# Patient Record
Sex: Female | Born: 1950 | Race: White | Hispanic: No | Marital: Married | Smoking: Never smoker
Health system: Southern US, Community
[De-identification: ages and names within clinical notes are randomized; demographics above are authoritative.]

## PROBLEM LIST (undated history)

## (undated) DIAGNOSIS — Z9889 Other specified postprocedural states: Secondary | ICD-10-CM

## (undated) DIAGNOSIS — R7301 Impaired fasting glucose: Secondary | ICD-10-CM

## (undated) DIAGNOSIS — M1990 Unspecified osteoarthritis, unspecified site: Secondary | ICD-10-CM

## (undated) DIAGNOSIS — I6529 Occlusion and stenosis of unspecified carotid artery: Secondary | ICD-10-CM

## (undated) DIAGNOSIS — R112 Nausea with vomiting, unspecified: Secondary | ICD-10-CM

## (undated) DIAGNOSIS — J9819 Other pulmonary collapse: Secondary | ICD-10-CM

## (undated) HISTORY — PX: KNEE ARTHROSCOPY: SUR90

## (undated) HISTORY — DX: Unspecified osteoarthritis, unspecified site: M19.90

## (undated) HISTORY — DX: Other pulmonary collapse: J98.19

## (undated) HISTORY — PX: ANKLE SURGERY: SHX546

## (undated) HISTORY — PX: LUNG SURGERY: SHX703

## (undated) HISTORY — PX: LUMBAR LAMINECTOMY: SHX95

## (undated) HISTORY — PX: COLONOSCOPY: SHX174

## (undated) HISTORY — DX: Impaired fasting glucose: R73.01

## (undated) HISTORY — PX: THYROIDECTOMY: SHX17

## (undated) HISTORY — DX: Occlusion and stenosis of unspecified carotid artery: I65.29

## (undated) HISTORY — PX: EYE SURGERY: SHX253

---

## 1998-07-23 ENCOUNTER — Encounter: Payer: Self-pay | Admitting: Orthopedic Surgery

## 1998-07-23 ENCOUNTER — Ambulatory Visit (HOSPITAL_COMMUNITY): Admission: RE | Admit: 1998-07-23 | Discharge: 1998-07-23 | Payer: Self-pay | Admitting: Orthopedic Surgery

## 1998-08-06 ENCOUNTER — Encounter: Payer: Self-pay | Admitting: Orthopedic Surgery

## 1998-08-06 ENCOUNTER — Ambulatory Visit (HOSPITAL_COMMUNITY): Admission: RE | Admit: 1998-08-06 | Discharge: 1998-08-06 | Payer: Self-pay | Admitting: Orthopedic Surgery

## 1998-08-20 ENCOUNTER — Ambulatory Visit (HOSPITAL_COMMUNITY): Admission: RE | Admit: 1998-08-20 | Discharge: 1998-08-20 | Payer: Self-pay | Admitting: Orthopedic Surgery

## 1999-01-15 ENCOUNTER — Ambulatory Visit (HOSPITAL_COMMUNITY): Admission: RE | Admit: 1999-01-15 | Discharge: 1999-01-15 | Payer: Self-pay | Admitting: Orthopedic Surgery

## 1999-01-15 ENCOUNTER — Encounter: Payer: Self-pay | Admitting: Orthopedic Surgery

## 1999-02-04 ENCOUNTER — Encounter: Payer: Self-pay | Admitting: Orthopedic Surgery

## 1999-02-04 ENCOUNTER — Ambulatory Visit (HOSPITAL_COMMUNITY): Admission: RE | Admit: 1999-02-04 | Discharge: 1999-02-04 | Payer: Self-pay | Admitting: Orthopedic Surgery

## 1999-02-19 ENCOUNTER — Ambulatory Visit (HOSPITAL_COMMUNITY): Admission: RE | Admit: 1999-02-19 | Discharge: 1999-02-19 | Payer: Self-pay | Admitting: Orthopedic Surgery

## 1999-02-19 ENCOUNTER — Encounter: Payer: Self-pay | Admitting: Orthopedic Surgery

## 1999-11-03 ENCOUNTER — Encounter: Admission: RE | Admit: 1999-11-03 | Discharge: 1999-11-03 | Payer: Self-pay | Admitting: Obstetrics and Gynecology

## 1999-11-03 ENCOUNTER — Encounter: Payer: Self-pay | Admitting: Obstetrics and Gynecology

## 2000-03-08 ENCOUNTER — Encounter: Payer: Self-pay | Admitting: Neurosurgery

## 2000-03-10 ENCOUNTER — Inpatient Hospital Stay (HOSPITAL_COMMUNITY): Admission: RE | Admit: 2000-03-10 | Discharge: 2000-03-11 | Payer: Self-pay | Admitting: Neurosurgery

## 2000-03-10 ENCOUNTER — Encounter: Payer: Self-pay | Admitting: Neurosurgery

## 2000-03-15 ENCOUNTER — Ambulatory Visit (HOSPITAL_BASED_OUTPATIENT_CLINIC_OR_DEPARTMENT_OTHER): Admission: RE | Admit: 2000-03-15 | Discharge: 2000-03-15 | Payer: Self-pay | Admitting: Plastic Surgery

## 2000-04-29 ENCOUNTER — Encounter: Payer: Self-pay | Admitting: Neurosurgery

## 2000-04-29 ENCOUNTER — Ambulatory Visit (HOSPITAL_COMMUNITY): Admission: RE | Admit: 2000-04-29 | Discharge: 2000-04-29 | Payer: Self-pay | Admitting: Neurosurgery

## 2000-05-06 ENCOUNTER — Ambulatory Visit (HOSPITAL_COMMUNITY): Admission: RE | Admit: 2000-05-06 | Discharge: 2000-05-07 | Payer: Self-pay | Admitting: Neurosurgery

## 2000-05-18 ENCOUNTER — Encounter: Payer: Self-pay | Admitting: Emergency Medicine

## 2000-05-19 ENCOUNTER — Inpatient Hospital Stay (HOSPITAL_COMMUNITY): Admission: EM | Admit: 2000-05-19 | Discharge: 2000-05-20 | Payer: Self-pay | Admitting: *Deleted

## 2000-05-19 ENCOUNTER — Encounter: Payer: Self-pay | Admitting: Thoracic Surgery (Cardiothoracic Vascular Surgery)

## 2000-05-20 ENCOUNTER — Encounter: Payer: Self-pay | Admitting: Thoracic Surgery (Cardiothoracic Vascular Surgery)

## 2000-05-26 ENCOUNTER — Encounter
Admission: RE | Admit: 2000-05-26 | Discharge: 2000-05-26 | Payer: Self-pay | Admitting: Thoracic Surgery (Cardiothoracic Vascular Surgery)

## 2000-05-26 ENCOUNTER — Encounter: Payer: Self-pay | Admitting: Thoracic Surgery (Cardiothoracic Vascular Surgery)

## 2000-12-12 ENCOUNTER — Ambulatory Visit (HOSPITAL_COMMUNITY): Admission: RE | Admit: 2000-12-12 | Discharge: 2000-12-12 | Payer: Self-pay | Admitting: Neurosurgery

## 2000-12-12 ENCOUNTER — Encounter: Payer: Self-pay | Admitting: Neurosurgery

## 2001-10-11 ENCOUNTER — Ambulatory Visit (HOSPITAL_COMMUNITY): Admission: RE | Admit: 2001-10-11 | Discharge: 2001-10-11 | Payer: Self-pay | Admitting: Internal Medicine

## 2001-10-11 ENCOUNTER — Encounter: Payer: Self-pay | Admitting: Internal Medicine

## 2001-12-27 ENCOUNTER — Ambulatory Visit (HOSPITAL_COMMUNITY): Admission: RE | Admit: 2001-12-27 | Discharge: 2001-12-27 | Payer: Self-pay | Admitting: Otolaryngology

## 2001-12-27 ENCOUNTER — Encounter: Payer: Self-pay | Admitting: Otolaryngology

## 2001-12-27 ENCOUNTER — Encounter (INDEPENDENT_AMBULATORY_CARE_PROVIDER_SITE_OTHER): Payer: Self-pay | Admitting: *Deleted

## 2002-04-12 ENCOUNTER — Encounter (INDEPENDENT_AMBULATORY_CARE_PROVIDER_SITE_OTHER): Payer: Self-pay | Admitting: *Deleted

## 2002-04-12 ENCOUNTER — Inpatient Hospital Stay (HOSPITAL_COMMUNITY): Admission: RE | Admit: 2002-04-12 | Discharge: 2002-04-13 | Payer: Self-pay | Admitting: Otolaryngology

## 2003-08-20 ENCOUNTER — Other Ambulatory Visit: Admission: RE | Admit: 2003-08-20 | Discharge: 2003-08-20 | Payer: Self-pay | Admitting: Gynecology

## 2003-08-27 ENCOUNTER — Encounter: Payer: Self-pay | Admitting: Gynecology

## 2003-08-27 ENCOUNTER — Encounter: Admission: RE | Admit: 2003-08-27 | Discharge: 2003-08-27 | Payer: Self-pay | Admitting: Gynecology

## 2004-09-15 ENCOUNTER — Other Ambulatory Visit: Admission: RE | Admit: 2004-09-15 | Discharge: 2004-09-15 | Payer: Self-pay | Admitting: Gynecology

## 2005-03-30 ENCOUNTER — Ambulatory Visit: Payer: Self-pay | Admitting: Cardiology

## 2005-05-13 ENCOUNTER — Ambulatory Visit: Payer: Self-pay | Admitting: Internal Medicine

## 2005-05-13 ENCOUNTER — Encounter: Payer: Self-pay | Admitting: Internal Medicine

## 2005-09-04 ENCOUNTER — Ambulatory Visit: Payer: Self-pay | Admitting: Internal Medicine

## 2005-09-16 ENCOUNTER — Other Ambulatory Visit: Admission: RE | Admit: 2005-09-16 | Discharge: 2005-09-16 | Payer: Self-pay | Admitting: Gynecology

## 2006-03-13 ENCOUNTER — Emergency Department (HOSPITAL_COMMUNITY): Admission: EM | Admit: 2006-03-13 | Discharge: 2006-03-14 | Payer: Self-pay | Admitting: Emergency Medicine

## 2006-10-06 ENCOUNTER — Other Ambulatory Visit: Admission: RE | Admit: 2006-10-06 | Discharge: 2006-10-06 | Payer: Self-pay | Admitting: Gynecology

## 2008-01-31 ENCOUNTER — Encounter: Payer: Self-pay | Admitting: Internal Medicine

## 2008-05-29 ENCOUNTER — Other Ambulatory Visit: Admission: RE | Admit: 2008-05-29 | Discharge: 2008-05-29 | Payer: Self-pay | Admitting: Gynecology

## 2009-01-10 ENCOUNTER — Telehealth: Payer: Self-pay | Admitting: Internal Medicine

## 2009-01-10 ENCOUNTER — Inpatient Hospital Stay (HOSPITAL_COMMUNITY): Admission: EM | Admit: 2009-01-10 | Discharge: 2009-01-15 | Payer: Self-pay | Admitting: Emergency Medicine

## 2009-01-10 ENCOUNTER — Ambulatory Visit: Payer: Self-pay | Admitting: Internal Medicine

## 2009-01-10 ENCOUNTER — Ambulatory Visit: Payer: Self-pay | Admitting: Surgery

## 2009-01-10 DIAGNOSIS — R079 Chest pain, unspecified: Secondary | ICD-10-CM

## 2009-01-10 DIAGNOSIS — J930 Spontaneous tension pneumothorax: Secondary | ICD-10-CM

## 2009-01-10 DIAGNOSIS — R0609 Other forms of dyspnea: Secondary | ICD-10-CM | POA: Insufficient documentation

## 2009-01-10 DIAGNOSIS — R0989 Other specified symptoms and signs involving the circulatory and respiratory systems: Secondary | ICD-10-CM

## 2009-01-10 LAB — CONVERTED CEMR LAB
Basophils Absolute: 0 10*3/uL (ref 0.0–0.1)
Basophils Relative: 0.4 % (ref 0.0–3.0)
Hemoglobin: 14.4 g/dL (ref 12.0–15.0)
Lymphocytes Relative: 33.8 % (ref 12.0–46.0)
MCHC: 35.4 g/dL (ref 30.0–36.0)
Monocytes Relative: 8.4 % (ref 3.0–12.0)
Neutro Abs: 3 10*3/uL (ref 1.4–7.7)
Neutrophils Relative %: 53.4 % (ref 43.0–77.0)
RBC: 4.61 M/uL (ref 3.87–5.11)
WBC: 5.6 10*3/uL (ref 4.5–10.5)

## 2009-01-11 ENCOUNTER — Encounter (INDEPENDENT_AMBULATORY_CARE_PROVIDER_SITE_OTHER): Payer: Self-pay | Admitting: *Deleted

## 2009-01-11 ENCOUNTER — Encounter: Payer: Self-pay | Admitting: Thoracic Surgery (Cardiothoracic Vascular Surgery)

## 2009-01-24 ENCOUNTER — Encounter
Admission: RE | Admit: 2009-01-24 | Discharge: 2009-01-24 | Payer: Self-pay | Admitting: Thoracic Surgery (Cardiothoracic Vascular Surgery)

## 2009-01-24 ENCOUNTER — Encounter: Payer: Self-pay | Admitting: Internal Medicine

## 2009-01-24 ENCOUNTER — Ambulatory Visit: Payer: Self-pay | Admitting: Thoracic Surgery (Cardiothoracic Vascular Surgery)

## 2009-02-22 ENCOUNTER — Encounter: Payer: Self-pay | Admitting: Internal Medicine

## 2009-02-25 ENCOUNTER — Ambulatory Visit: Payer: Self-pay | Admitting: Internal Medicine

## 2009-02-25 DIAGNOSIS — R7309 Other abnormal glucose: Secondary | ICD-10-CM

## 2009-02-25 DIAGNOSIS — E041 Nontoxic single thyroid nodule: Secondary | ICD-10-CM

## 2009-02-25 DIAGNOSIS — E875 Hyperkalemia: Secondary | ICD-10-CM

## 2009-02-26 ENCOUNTER — Ambulatory Visit: Payer: Self-pay | Admitting: Internal Medicine

## 2009-02-27 LAB — CONVERTED CEMR LAB
Calcium: 9.5 mg/dL (ref 8.4–10.5)
Free T4: 0.8 ng/dL (ref 0.6–1.6)
Hgb A1c MFr Bld: 6 % (ref 4.6–6.5)
T3, Free: 3.2 pg/mL (ref 2.3–4.2)

## 2009-02-28 ENCOUNTER — Encounter (INDEPENDENT_AMBULATORY_CARE_PROVIDER_SITE_OTHER): Payer: Self-pay | Admitting: *Deleted

## 2009-03-01 ENCOUNTER — Telehealth: Payer: Self-pay | Admitting: Internal Medicine

## 2009-03-01 ENCOUNTER — Encounter: Admission: RE | Admit: 2009-03-01 | Discharge: 2009-03-01 | Payer: Self-pay | Admitting: Internal Medicine

## 2009-03-01 ENCOUNTER — Encounter (INDEPENDENT_AMBULATORY_CARE_PROVIDER_SITE_OTHER): Payer: Self-pay | Admitting: *Deleted

## 2009-03-25 ENCOUNTER — Encounter: Payer: Self-pay | Admitting: Internal Medicine

## 2009-03-25 ENCOUNTER — Ambulatory Visit: Payer: Self-pay | Admitting: Thoracic Surgery (Cardiothoracic Vascular Surgery)

## 2009-03-25 ENCOUNTER — Encounter
Admission: RE | Admit: 2009-03-25 | Discharge: 2009-03-25 | Payer: Self-pay | Admitting: Thoracic Surgery (Cardiothoracic Vascular Surgery)

## 2009-12-27 IMAGING — CR DG CHEST 1V PORT
1 series · 1 of 1 positions shown · non-contrast
Comparison: 01/12/2009 and chest CT 01/10/2009

CLINICAL DATA: Postop thoracotomy

PORTABLE CHEST - 1 VIEW

[view not recorded]
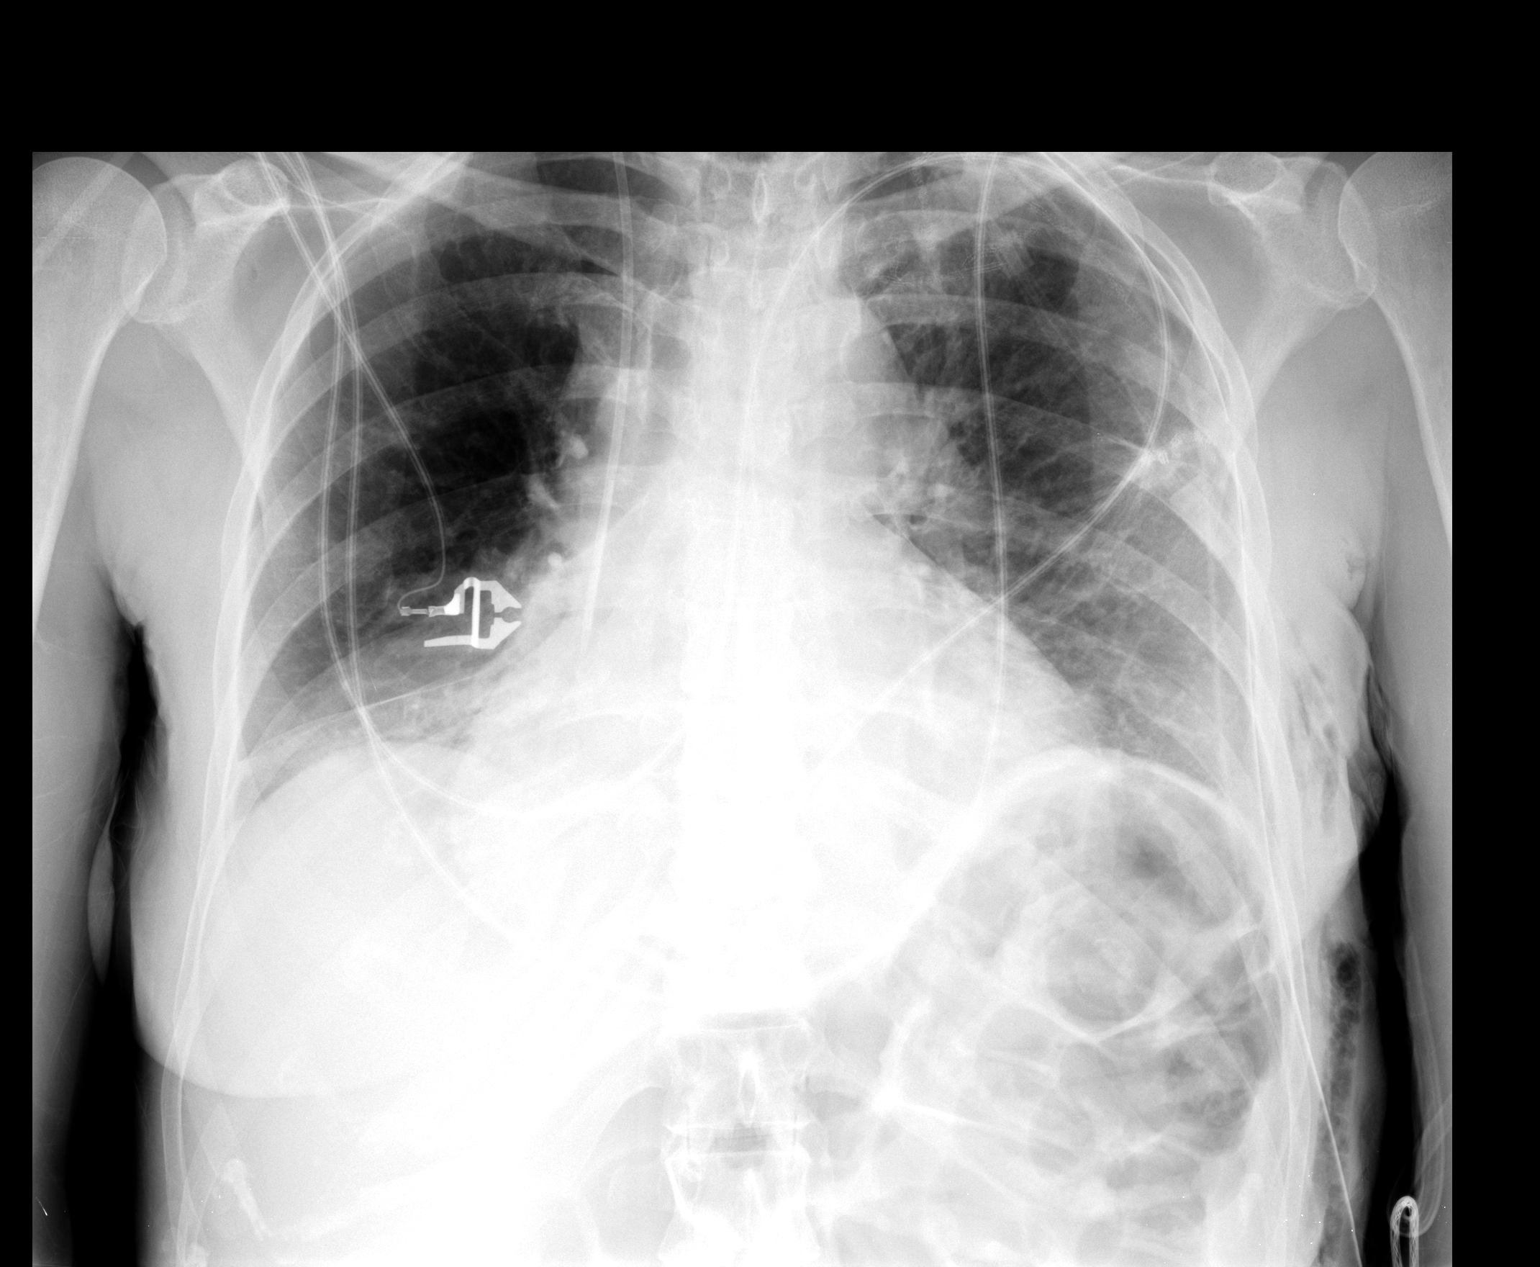

[1 of 1 positions shown; findings below may reference images not displayed]

FINDINGS: Right IJ central venous catheter terminates at the
cavoatrial junction, stable.  Surgical suture projects over the
left lung apex and the lateral left mid hemithorax.  Left chest
tube remains in place with the tip near the apex.  No visible
pneumothorax.

Stable heart size.  Low low lung volumes with bibasilar
atelectasis.  Subcutaneous air along the left chest wall,
unchanged.
IMPRESSION: Head.
1.  Postsurgical changes of the left lung with left chest tube in
place.  No visible pneumothorax on today's radiograph.
2.  Bibasilar atelectasis.

## 2009-12-28 IMAGING — CR DG CHEST 1V PORT
1 series · 1 of 1 positions shown · non-contrast
Comparison: 01/13/2009

CLINICAL DATA: Postop from left thoracotomy.  Follow-up
pneumothorax.

PORTABLE CHEST - 1 VIEW

[view not recorded]
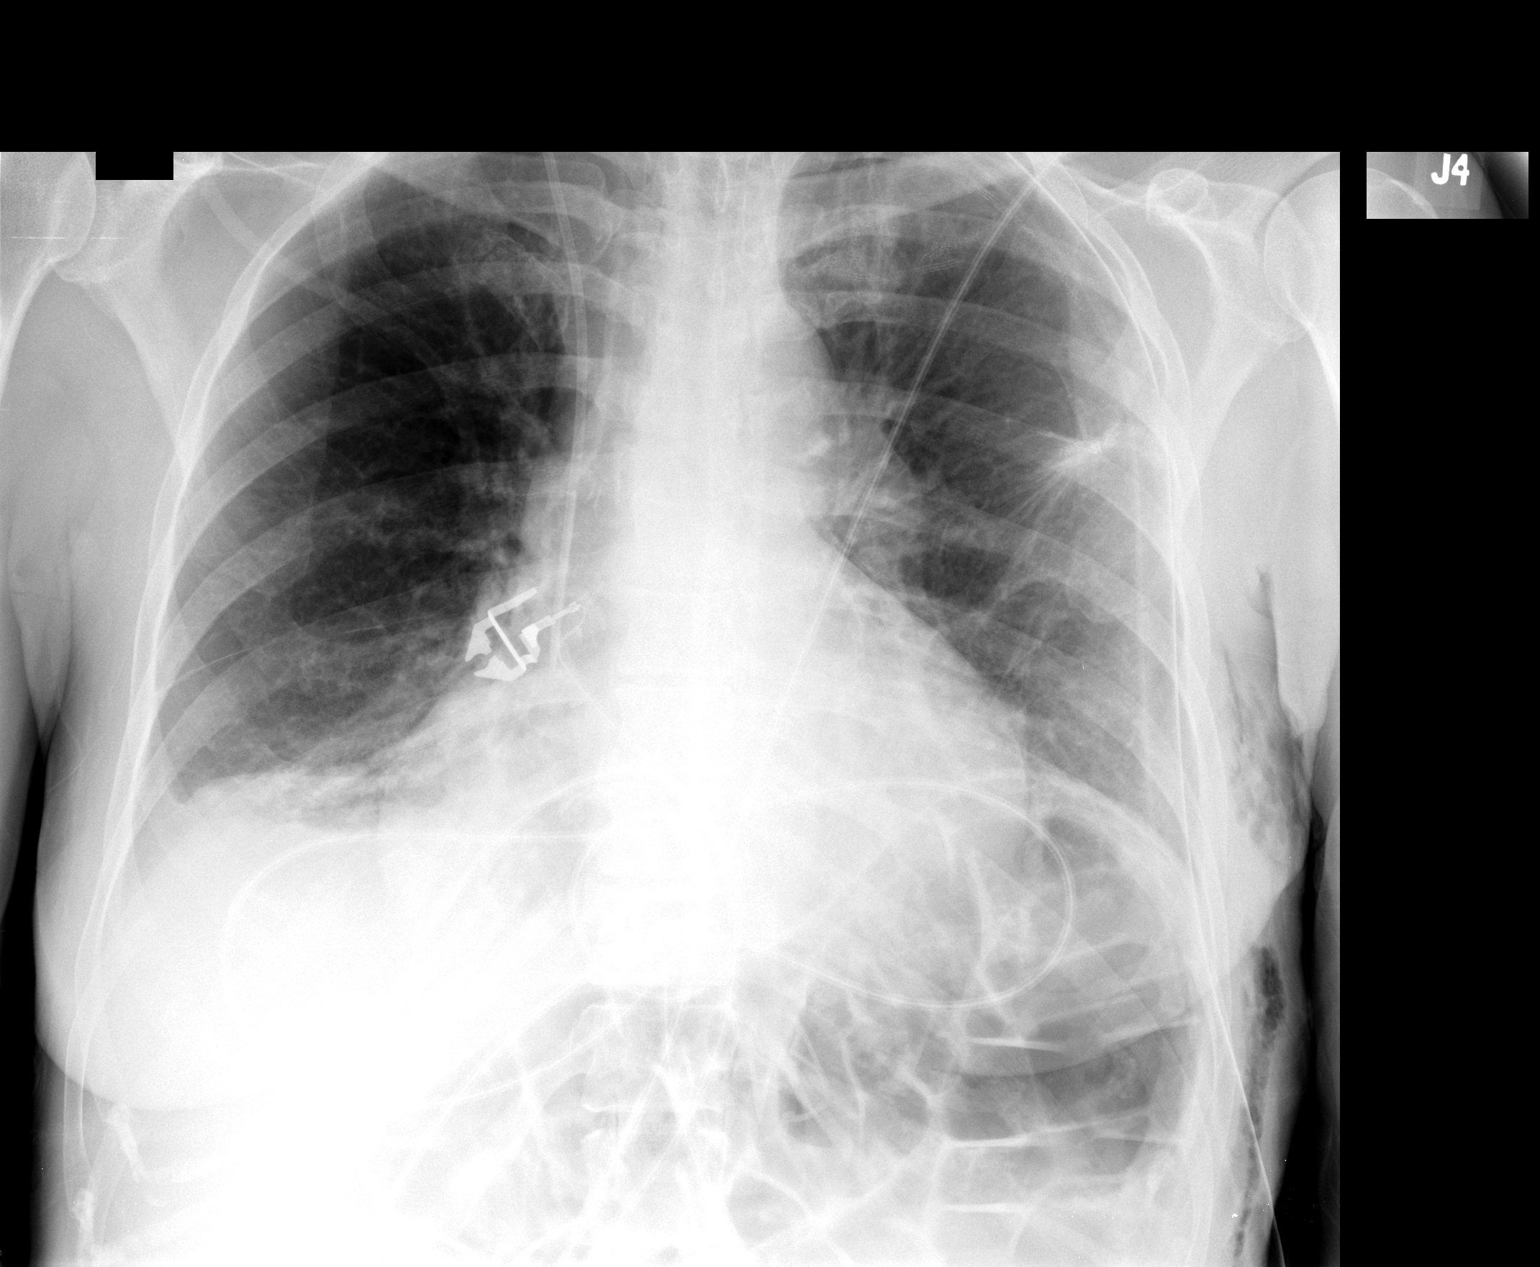

[1 of 1 positions shown; findings below may reference images not displayed]

FINDINGS: Left chest tube and right jugular central venous catheter
remain in appropriate position.  A small, approximately 5% left
apical pneumothorax is seen on today's study.  Peripheral left
upper lobe scarring is unchanged.  Atelectasis in the right lung
base is also stable.  No new or worsening areas of pulmonary
opacity are seen.
IMPRESSION: 1.  Small approximately 5% left apical pneumothorax, with left
chest tube in place.
2. Mild right basilar atelectasis, without significant change.

## 2010-01-07 IMAGING — CR DG CHEST 2V
2 series · 2 of 2 positions shown · non-contrast
Comparison: 01/15/2009

CLINICAL DATA: Status post left thoracotomy and lung surgery for
pneumothorax.

CHEST - 2 VIEW

[w chest pa]
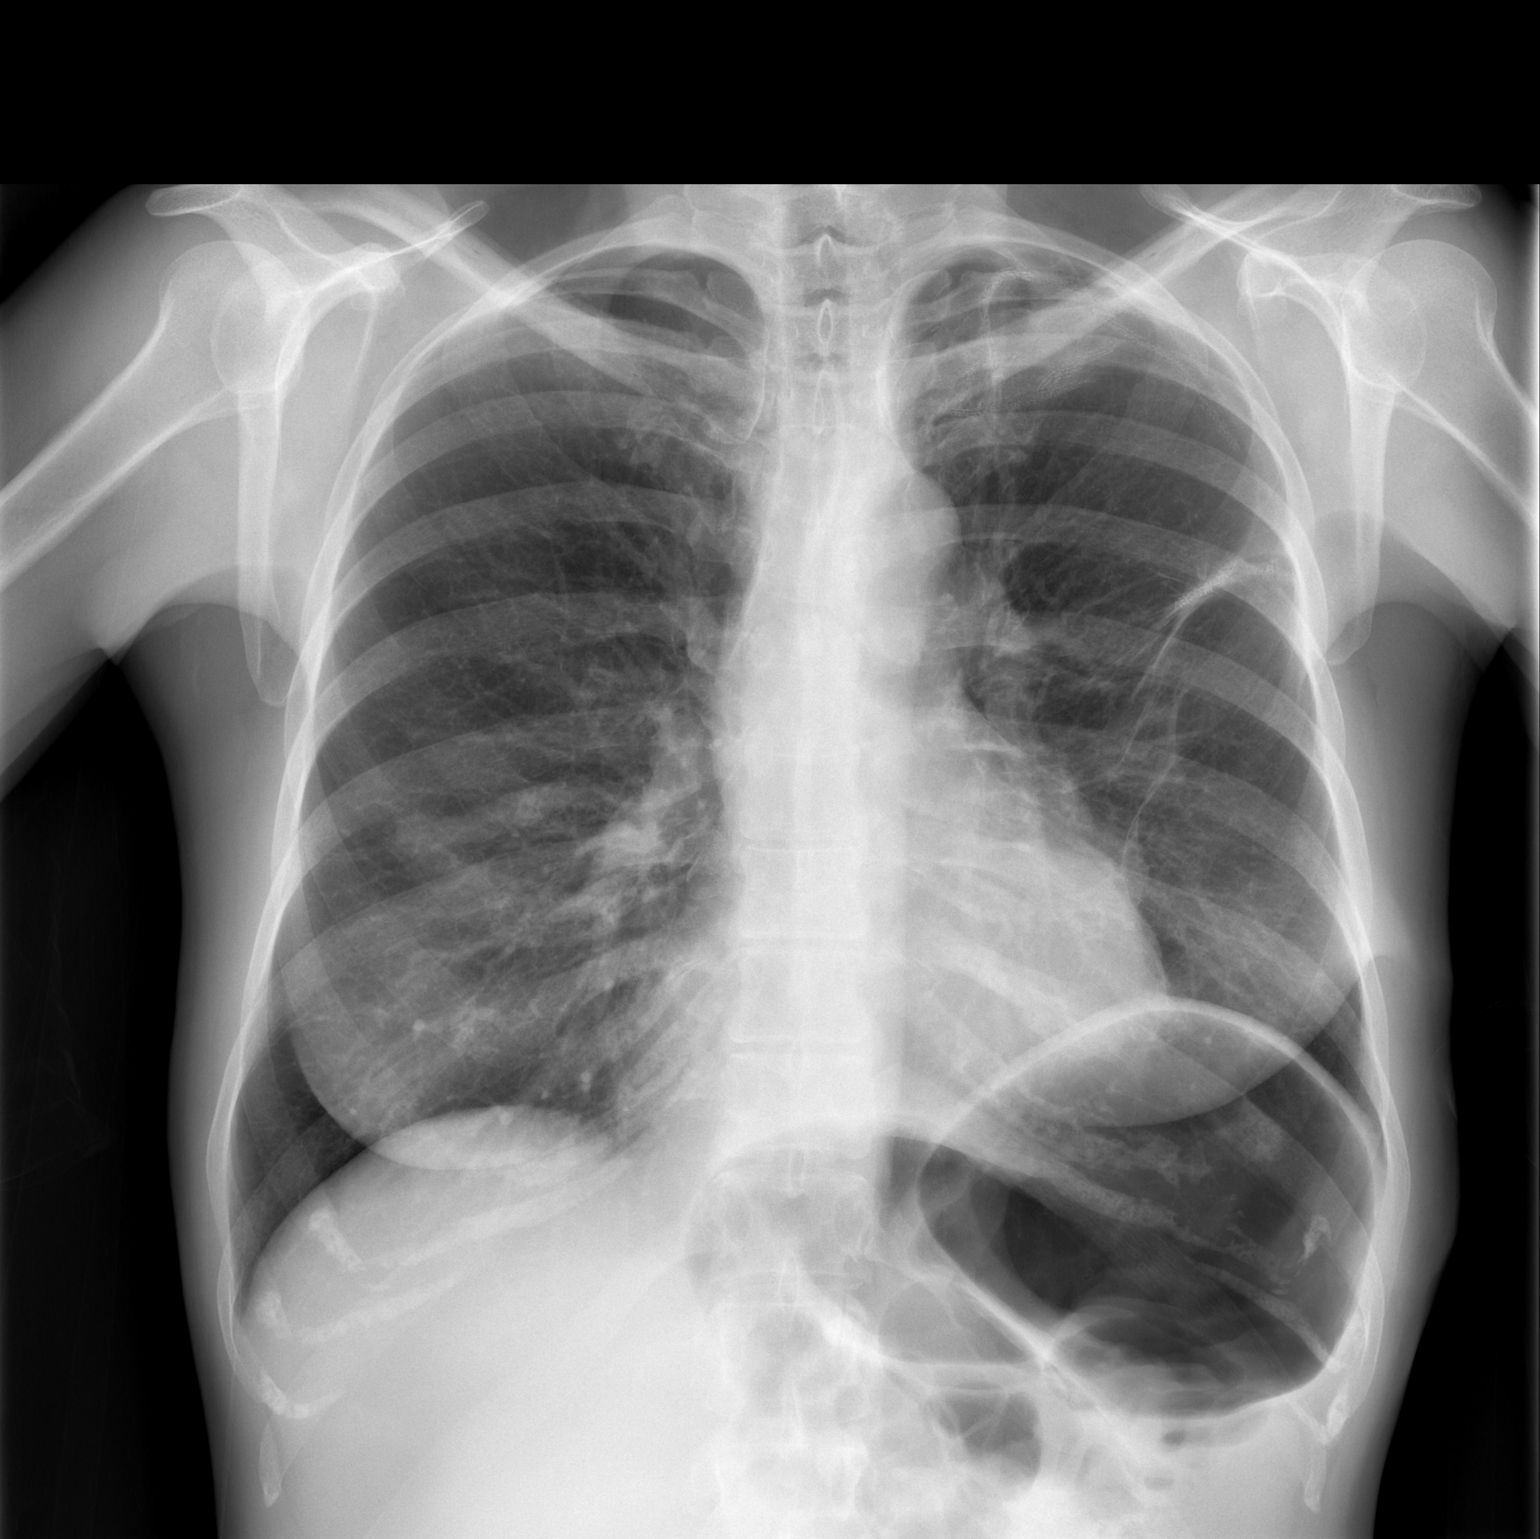

[w chest lat]
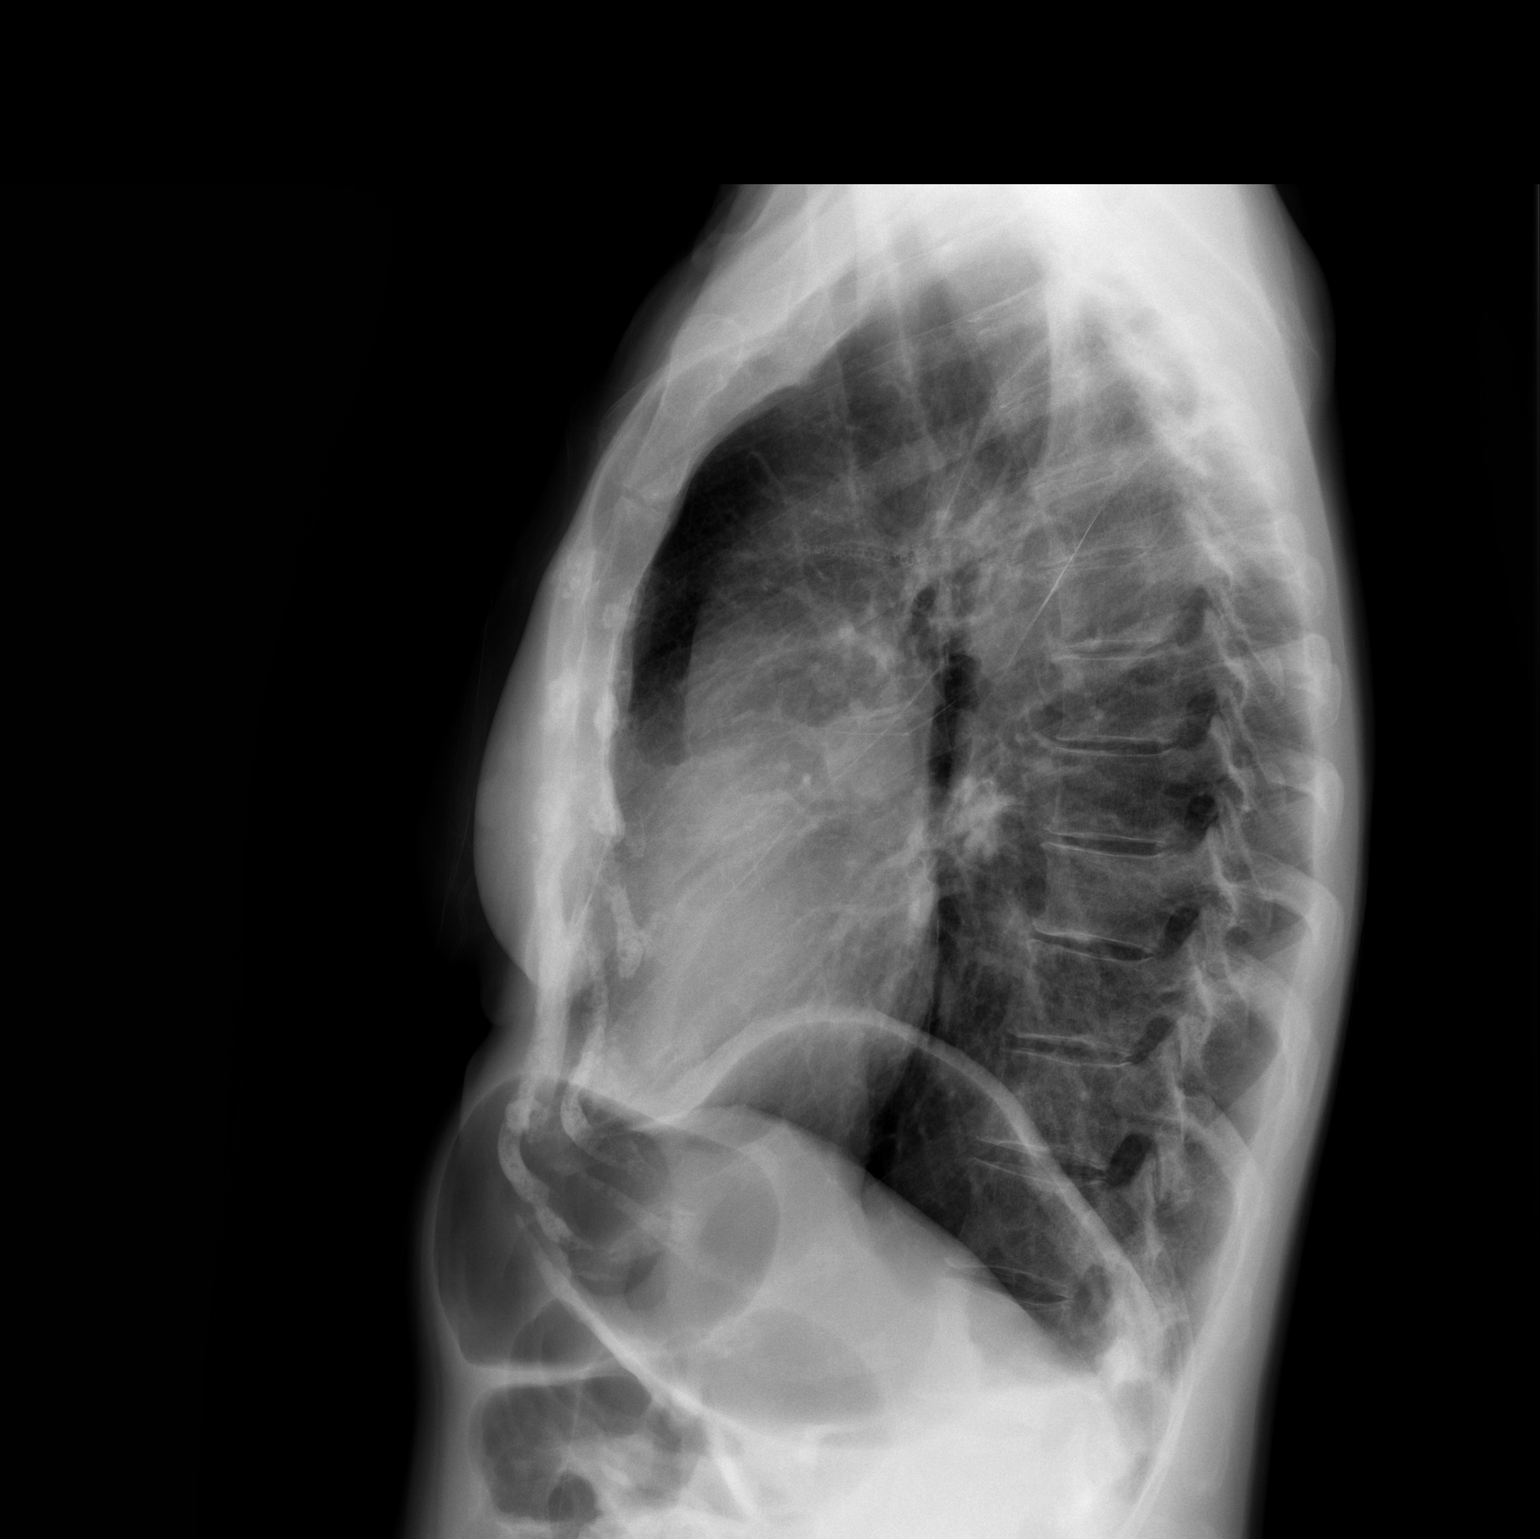

[2 of 2 positions shown; findings below may reference images not displayed]

FINDINGS: No evidence of further pneumothorax on the left.
Multiple suture lines again evident after left lung surgery.
Underlying COPD is stable.  No pleural effusions or edema.
IMPRESSION: No pneumothorax or other acute findings postoperatively.  Stable
COPD.

## 2010-12-08 ENCOUNTER — Encounter: Payer: Self-pay | Admitting: Thoracic Surgery (Cardiothoracic Vascular Surgery)

## 2010-12-18 NOTE — Procedures (Signed)
Summary: Colonoscopy/[facility]  Colonoscopy/[facility]   Imported By: Lanelle Bal 09/06/2010 08:59:22  _____________________________________________________________________  External Attachment:    Type:   Image     Comment:   External Document

## 2011-03-03 LAB — BLOOD GAS, ARTERIAL
Acid-Base Excess: 1.8 mmol/L (ref 0.0–2.0)
O2 Content: 2 L/min
O2 Saturation: 92.6 %
pO2, Arterial: 64.6 mmHg — ABNORMAL LOW (ref 80.0–100.0)

## 2011-03-03 LAB — POCT I-STAT, CHEM 8
BUN: 20 mg/dL (ref 6–23)
Calcium, Ion: 1.18 mmol/L (ref 1.12–1.32)
Creatinine, Ser: 0.9 mg/dL (ref 0.4–1.2)
Glucose, Bld: 110 mg/dL — ABNORMAL HIGH (ref 70–99)
TCO2: 26 mmol/L (ref 0–100)

## 2011-03-03 LAB — CBC
HCT: 33 % — ABNORMAL LOW (ref 36.0–46.0)
Hemoglobin: 11.6 g/dL — ABNORMAL LOW (ref 12.0–15.0)
Hemoglobin: 14.4 g/dL (ref 12.0–15.0)
MCHC: 34.9 g/dL (ref 30.0–36.0)
MCHC: 35 g/dL (ref 30.0–36.0)
MCV: 89.3 fL (ref 78.0–100.0)
MCV: 90 fL (ref 78.0–100.0)
RBC: 4.63 MIL/uL (ref 3.87–5.11)
RDW: 12.1 % (ref 11.5–15.5)
RDW: 12.3 % (ref 11.5–15.5)

## 2011-03-03 LAB — BASIC METABOLIC PANEL
CO2: 25 mEq/L (ref 19–32)
Calcium: 8.2 mg/dL — ABNORMAL LOW (ref 8.4–10.5)
Chloride: 103 mEq/L (ref 96–112)
Glucose, Bld: 113 mg/dL — ABNORMAL HIGH (ref 70–99)
Sodium: 133 mEq/L — ABNORMAL LOW (ref 135–145)

## 2011-03-03 LAB — COMPREHENSIVE METABOLIC PANEL
BUN: 8 mg/dL (ref 6–23)
Calcium: 8.5 mg/dL (ref 8.4–10.5)
Creatinine, Ser: 0.69 mg/dL (ref 0.4–1.2)
Glucose, Bld: 109 mg/dL — ABNORMAL HIGH (ref 70–99)
Total Protein: 5.5 g/dL — ABNORMAL LOW (ref 6.0–8.3)

## 2011-03-03 LAB — DIFFERENTIAL
Basophils Absolute: 0.1 10*3/uL (ref 0.0–0.1)
Basophils Relative: 1 % (ref 0–1)
Eosinophils Absolute: 0.2 10*3/uL (ref 0.0–0.7)
Monocytes Absolute: 0.5 10*3/uL (ref 0.1–1.0)
Monocytes Relative: 9 % (ref 3–12)
Neutrophils Relative %: 53 % (ref 43–77)

## 2011-03-03 LAB — LIPID PANEL
Cholesterol: 221 mg/dL — ABNORMAL HIGH (ref 0–200)
LDL Cholesterol: 106 mg/dL — ABNORMAL HIGH (ref 0–99)
Triglycerides: 80 mg/dL (ref <150)

## 2011-03-03 LAB — TYPE AND SCREEN

## 2011-03-03 LAB — ABO/RH: ABO/RH(D): O POS

## 2011-03-08 ENCOUNTER — Emergency Department (HOSPITAL_COMMUNITY)
Admission: EM | Admit: 2011-03-08 | Discharge: 2011-03-08 | Disposition: A | Discharge status: Home / Self Care | Payer: PRIVATE HEALTH INSURANCE | Attending: Emergency Medicine | Admitting: Emergency Medicine

## 2011-03-08 ENCOUNTER — Emergency Department (HOSPITAL_COMMUNITY): Payer: PRIVATE HEALTH INSURANCE

## 2011-03-08 DIAGNOSIS — R110 Nausea: Secondary | ICD-10-CM | POA: Insufficient documentation

## 2011-03-08 DIAGNOSIS — N201 Calculus of ureter: Secondary | ICD-10-CM | POA: Insufficient documentation

## 2011-03-08 DIAGNOSIS — N1330 Unspecified hydronephrosis: Secondary | ICD-10-CM | POA: Insufficient documentation

## 2011-03-08 DIAGNOSIS — R1031 Right lower quadrant pain: Secondary | ICD-10-CM | POA: Insufficient documentation

## 2011-03-08 DIAGNOSIS — Z79899 Other long term (current) drug therapy: Secondary | ICD-10-CM | POA: Insufficient documentation

## 2011-03-08 LAB — DIFFERENTIAL
Basophils Relative: 0 % (ref 0–1)
Eosinophils Absolute: 0.1 10*3/uL (ref 0.0–0.7)
Eosinophils Relative: 1 % (ref 0–5)
Monocytes Relative: 5 % (ref 3–12)
Neutrophils Relative %: 83 % — ABNORMAL HIGH (ref 43–77)

## 2011-03-08 LAB — CBC
MCH: 30.4 pg (ref 26.0–34.0)
Platelets: 198 10*3/uL (ref 150–400)
RBC: 4.47 MIL/uL (ref 3.87–5.11)
RDW: 12.4 % (ref 11.5–15.5)
WBC: 8.6 10*3/uL (ref 4.0–10.5)

## 2011-03-08 LAB — BASIC METABOLIC PANEL
BUN: 16 mg/dL (ref 6–23)
Chloride: 108 mEq/L (ref 96–112)
Creatinine, Ser: 1.01 mg/dL (ref 0.4–1.2)
GFR calc Af Amer: >60 mL/min (ref >60)
GFR calc non Af Amer: 56 mL/min — ABNORMAL LOW (ref >60)

## 2011-03-08 LAB — URINALYSIS, ROUTINE W REFLEX MICROSCOPIC
Glucose, UA: NEGATIVE mg/dL
Nitrite: NEGATIVE
pH: 7.5 (ref 5.0–8.0)

## 2011-03-08 LAB — URINE MICROSCOPIC-ADD ON

## 2011-03-31 NOTE — H&P (Signed)
Desiree Byrd, Desiree Byrd                    ACCOUNT NO.:  [id number]   MEDICAL RECORD NO.:  [id number]          PATIENT TYPE:  INP   LOCATION:  3734                         FACILITY:  [facility]   PHYSICIAN:  Evelene Croon, M.D.     DATE OF BIRTH:  [birth year]   DATE OF ADMISSION:  01/10/2009  DATE OF DISCHARGE:                              HISTORY & PHYSICAL   REASON FOR ADMISSION:  Shortness of breath and left-sided chest pain  with possible left spontaneous pneumothorax.   CLINICAL HISTORY:  This patient is a 60 year old nonsmoker with a  history of spontaneous left pneumothorax in 2001, treated by Dr.  [name] with a left chest tube.  She had no further problems until  this past Monday when she started walking on a treadmill at 7 a.m. and  suddenly got left-sided chest discomfort and shortness of breath, which  she described as mild.  She said it felt like she had a catching in her  chest.  This persisted all day.  She had some cough associated with it.  Her symptoms persisted on Tuesday and she called Dr. Frederik Pear office for  an appointment.  She was seen today and was felt to have fairly clear  lung sounds on both sides.  Electrocardiogram and cardiac enzymes were  sent as well as a chest x-ray being performed.  The patient said that  she was called by Dr. Chestine Spore after he read her x-ray this afternoon and  he told her that she may have a large pneumothorax or possibly a bulla  and was told to come to the emergency room.  She said that she still has  some mild left-sided chest pain.  It is made worse by bending over or  lying down.  She has some mild shortness of breath with it.  She says  she has still been very active at home despite the symptoms.   REVIEW OF SYSTEMS:  GENERAL:  She denies any fever or chills.  She has  had no recent weight change.  She denies fatigue.  EYES:  Negative.  ENT:  She has had some postnasal drainage.  ENDOCRINE:  She denies  diabetes and hypothyroidism.  She  did have a partial thyroidectomy in  2003 for benign nodule.  CARDIOVASCULAR:  As mentioned above, she does  report some persistent left-sided chest discomfort.  She has had  exertional dyspnea over the last couple of days.  She has some shortness  of breath with lying down.  She denies palpitations or peripheral edema.  RESPIRATORY:  She has had some cough, but no sputum production.  She  denies hemoptysis.  She has had no wheezing.  GI:  She has had no nausea  or vomiting.  She denies melena or bright red blood per rectum.  GU:  She denies dysuria and hematuria.  NEUROLOGICAL:  She has had some  headaches.  She denies any focal weakness or numbness.  She has had no  dizziness or syncope.   Her past medical history is significant for spontaneous left  pneumothorax in 2001.  She is status post lumbar laminectomy in 2001 by  Dr. Newell Coral.  She is status post knee arthroscopy.  She is status post  surgery on ankle.  She is status post left hemithyroidectomy for a  benign nodule in 2003.   ALLERGIES:  None.   MEDICATIONS:  Celebrex 200 mg daily.   FAMILY HISTORY:  Negative for spontaneous pneumothorax.  She does have a  brother who has cystic fibrosis.  Mother has high blood pressure.  Her  father died of multiple sclerosis in his 30s.   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  She  has never smoked and drinks occasional alcohol.   PHYSICAL EXAMINATION:  VITAL SIGNS:  Her blood pressure is 100/65 and  her pulse is 80 and regular.  Respiratory rate is 16 and unlabored.  Oxygen saturation is 98% on room air.  GENERAL:  She is a thin, well-developed white female in no distress.  She is up walking around the room.  She is able to carry on conversation  without any difficulty.  HEENT:  Normocephalic and atraumatic.  Pupils are equal and reactive to  light and accommodation.  Extraocular muscles are intact.  Her throat is  clear.  Teeth are in good condition.   NECK:  Normal carotid pulses bilaterally.  No bruits.  There is no  adenopathy or thyromegaly.  There is a well-healed thyroidectomy scar.  CARDIAC:  Regular rate and rhythm with normal S1 and S2.  There is no  murmur, rub, or gallop.  LUNGS:  Good chest movement bilaterally.  Breath sounds are fairly equal  bilaterally and surprisingly good on the left side.  ABDOMEN:  Active bowel sounds.  Abdomen is soft and nontender.  There  are no palpable masses or organomegaly.  EXTREMITIES:  No peripheral edema.  Pedal pulses are palpable  bilaterally.  SKIN:  Warm and dry.   IMPRESSION:  Ms. Shain has a 3-day history of acute onset of left-sided  chest pain and shortness of breath which has been relatively mild, but  persistent.  Her chest x-ray is markedly abnormal with lucency on the  majority of the left hemithorax.  This is suspicious for a large  spontaneous pneumothorax, although I do not see a clear outline of the  collapsed lung.  She does have compressive atelectasis of the left lower  lobe.  She looks surprisingly good and has surprisingly good breath  sounds on the left side for somebody with a pneumothorax of this size.  This could represent a large bulla.  Since she is clinically doing well  with mild symptoms, I think it will be best to obtain a CT scan of the  chest to be sure whether this is a large spontaneous pneumothorax or a  bulla.  This will also allow [place] to rule out any other lung pathology.  We will admit her to the hospital and get the scan done as quickly as  possible.  I discussed the plan with her husband and they are in full  agreement.      Evelene Croon, M.D.  Electronically Signed     BB/MEDQ  D:  01/10/2009  T:  01/11/2009  Job:  [id number]

## 2011-03-31 NOTE — Discharge Summary (Signed)
Desiree Byrd, MOWRER                    ACCOUNT NO.:  [id number]   MEDICAL RECORD NO.:  [id number]          PATIENT TYPE:  INP   LOCATION:  3316                         FACILITY:  [facility]   PHYSICIAN:  Salvatore Decent. [name], M.D.DATE OF BIRTH:  1951/01/19   DATE OF ADMISSION:  01/10/2009  DATE OF DISCHARGE:  01/15/2009                               DISCHARGE SUMMARY   HISTORY:  The patient is a 60 year old nonsmoker with a history of  spontaneous left pneumothorax in 2001 treated by Dr. [name] with a  left chest tube.  She has no problems until Monday prior to admission  when she started walking on a treadmill at 7 a.m. and got left-sided  chest discomfort and shortness of breath, which she described as mild.  She said she felt like it had a catching in her chest.  This persisted  all day.  She had some cough associated with it.  Her symptoms persisted  on Tuesday and she called Dr. [name] for an appointment.  She was seen  and her lungs were felt to be clear.  Electrocardiogram and cardiac  enzymes were sent and she was sent for a chest x-ray.  She was felt to  possibly have a large pneumothorax versus bulla and was told to come to  the emergency room.  She continued to have some mild left-sided chest  pain.  It was made worse with bending over or lying down.  She had some  mild shortness of breath.  She remained active despite these symptoms.  She was seen in the emergency room by Dr. [name].  Findings were felt to  possibly represent a large left bulla versus pneumothorax and he felt it  would be best to obtain a CT scan to further delineate the situation.  She was admitted to the hospital for further evaluation treatment.   PAST MEDICAL HISTORY:  Spontaneous left pneumothorax, 2001.  She is also  status post lumbar laminectomy in 2001 by Dr. Newell Coral.  She has also  had a knee arthroscopy.  She has had ankle surgery.  She is status post  left hemithyroidectomy for a benign nodule in  2003.   ALLERGIES:  None.   MEDICATIONS:  Celebrex 200 mg daily.   FAMILY HISTORY, SOCIAL HISTORY, REVIEW OF SYMPTOMS, AND PHYSICAL  EXAMINATION:  Please see the history and physical done at the time of  admission.   HOSPITAL COURSE:  The patient was admitted.  The CT scan revealed a  large left pneumothorax with no evidence of significant tension in  addition to the left upper lobe atelectasis/collapse without evidence of  central obstructing lesion.  Due to these findings a video-assisted  thoracoscopy was felt to be indicated and on January 11, 2009, she was  taken to the operating room where she underwent the following procedure.  Left video-assisted thoracoscopic surgery with blebectomy and pleural  abrasion.  She tolerated the procedure well.  She was taken to the  Surgical Intensive Care Unit in a stable condition.   POSTOPERATIVE HOSPITAL COURSE:  The patient has done well overall.  Pathology revealed findings consistent with inflammatory changes as well  as emphysematous changes.  There was no evidence of malignancy.  The  chest tube has been discontinued using the routine protocols of  monitoring.  She shows no evidence of pneumothorax.  Her oxygen  saturations are 97% on room air.  Her incisions are all healing well.  She is tolerating routine activities.  She continues to have some mild  difficulties with cough, but this is improving with time.  Her overall  status is felt to be quite stable for discharge on today's date January 15, 2009.   CONDITION ON DISCHARGE:  Stable and improving.   MEDICATIONS ON DISCHARGE:  1. She can continue her Celebrex 200 mg daily.  2. Additionally, Tylox 1-2 every 4-6 hours as needed for pain.  3. Tussionex 5 mL twice daily as needed for cough.  4. She is also instructed that she can use her over-the-counter      regimen of antihistamines.   FOLLOWUP:  Suture removal next week in the office.  She will see Dr.  [name] in 2 weeks  with a chest x-ray.   INSTRUCTIONS:  The patient will receive written instructions in regard  to medications, activity, diet, wound care, and followup.   FINAL DIAGNOSIS:  Spontaneous pneumothorax, left side, now status post  video-assisted thoracoscopy with blebectomy and pleural abrasion.   OTHER DIAGNOSES:  1. Findings on Pathology consistent with chronic inflammation and      emphysematous changes of the lung.  2. History of previous left pneumothorax, 2001.  3. History of lumbar laminectomy, 2001.  4. History of knee arthroscopy.  5. History of ankle surgery.  6. History of left hemithyroidectomy for benign disease, 2003.      Rowe Clack, P.A.-C.      Salvatore Decent [name], M.D.  Electronically Signed    WEG/MEDQ  D:  01/15/2009  T:  01/15/2009  Job:  [id number]   cc:   Salvatore Decent. [name], M.D.  Quentin Mulling, MD

## 2011-03-31 NOTE — Assessment & Plan Note (Signed)
OFFICE VISIT   Desiree Byrd, Desiree Byrd  DOB:  October 14, 1951                                        Mar 25, 2009  CHART #:  [id number]   The patient had a left VATS and blebectomy for recurrent pneumothorax on  January 11, 2009.  She was last seen in the office on January 24, 2009,  at which time she was doing well.  She was still having some discomfort.  Her chest x-ray showed some platelike atelectasis in the area of the  staple line.  It is a little more prominent than expected.  She now  returns for followup film.  In the interim, she says that she has been  playing tennis.  She had been doing some Pilates, although that causes a  lot of pain and she still feels a lump in the subcostal region and has  pain around the ribs with certain movements.  She is not having to take  any pain medication for that.  She has not had any shortness of breath.   PHYSICAL EXAMINATION:  VITAL SIGNS:  Blood pressure 128/80, pulse 89,  respirations are 18, and her oxygen saturation is 98% on room air.  SKIN:  Incisions are well healed with no signs of infection.   Chest x-ray shows maturation and thinning of the area around the staple  line consistent with post-surgical scarring, no other issues.   IMPRESSION:  The patient is doing very well at this point in time.  Essentially her activities are unrestricted at the present time.  She  may still have some pain and soreness particularly with certain physical  movements.  I offered her a prescription for a narcotic if she needed  when the pain is severe, but she does not have pain significant enough  to warrant that.  She has been using nonsteroidals and Tylenol as  needed.  She has been very physically active.  She will continue to  be followed by Dr. Marga Melnick.  I would be happy to see her back  anytime if I can be of further assistance.   Salvatore Decent [name], M.D.  Electronically Signed   SCH/MEDQ  D:  03/25/2009  T:  03/26/2009   Job:  [id number]   cc:   Titus Dubin. Alwyn Ren, MD,FACP,FCCP

## 2011-03-31 NOTE — Op Note (Signed)
NAMEGENNETT, GARCIA                    ACCOUNT NO.:  [id number]   MEDICAL RECORD NO.:  [id number]          PATIENT TYPE:  INP   LOCATION:  3734                         FACILITY:  [facility]   PHYSICIAN:  Salvatore Decent. [name], M.D.DATE OF BIRTH:  11-18-1950   DATE OF PROCEDURE:  01/11/2009  DATE OF DISCHARGE:                               OPERATIVE REPORT   PREOPERATIVE DIAGNOSIS:  Recurrent left spontaneous pneumothorax.   POSTOPERATIVE DIAGNOSIS:  Recurrent left spontaneous pneumothorax.   PROCEDURE:  Left video-assisted thoracoscopic surgery, blebectomy , and  pleural abrasion.   SURGEON:  Salvatore Decent. [name], MD   ASSISTANT:  Rowe Clack, PA-C   ANESTHESIA:  General.   FINDINGS:  Small blebs on lateral aspect of left lung with air leak.  Frozen section of an abnormal appearing area in the left lung separate  from this area revealed some eosinophils and fibrosis.   CLINICAL NOTE:  Ms. Bergevin is a 60 year old woman who presented with a  recurrent left spontaneous pneumothorax on January 10, 2009.  She had  previously had a spontaneous pneumothorax 11 years before on that side  which had been treated with a chest tube.  She then not had any problems  in the interim.  She was clinically stable and the chest tube was not  necessary.  After discussion regarding the risk of recurrent  pneumothorax in the setting, the patient wished to proceed with VATS for  blebectomy and pleurodesis.  The indications, risks, benefits, and  alternatives were discussed in detail.  She understood and accepted the  risks and agreed to proceed.   OPERATIVE NOTE:  Ms. Tibbetts was brought to the preop holding area on  January 11, 2009.  There lines were placed by Anesthesia for central  venous access and arterial blood pressure monitoring.  She was taken to  the operating room, anesthetized, and intubated with a double lumen  endotracheal tube.  There was difficulty getting the tracheal tube  positioned  appropriately.  This was finally accomplished by Dr. Ivin Booty.  PAS hoses were in place and she had received intravenous antibiotics in  preop holding.  Once the endotracheal tube was in position, she was  placed in a right lateral decubitus position.  The left chest was  prepped and draped in the usual fashion.  An incision was made through  the patient's previous chest tube site.  It was carried through the skin  and subcutaneous tissue and the chest was gently entered bluntly using a  hemostat.  The scope was inserted through a port in this incision and it  was noted despite the chest CT appearance which suggested potential  adhesions of the lower lobe that there were no significant pleural  adhesions.  Additional ports were placed anteriorly and posteriorly in  the fifth intercostal space.Systematic inspection of the visceral  pleural surface revealed no large blebs.  The lung was partially  inflated and using saline across the surface of the lung to identify an  area where there was an air leak.  Small blebs were identified and were  actively leaking.  The area of bleb formation was grasped and excised  with the sequential firings of an Endo-GIA stapler with a 4.5 mm staple  depth.  With the lung inflated, there was a slightly irregular  appearance to the lung tissue.  In one area, there was almost a nodular  appearance but there was no palpable nodule.  This area was excised with  two firings of an Endo-GIA stapler and was sent for frozen section.  Dr.  Debby Bud had reported some eosinophils and inflammation.  No tumor was  seen.   The apex of the lung then was stapled off.  Again, this had no distinct  blebs, likewise the tip of the superior segment of the lower lobe was  excised with the stapler as well.  All staple lines were inspected for  hemostasis.  Chest was copiously irrigated with warm saline.  Test  inflation of the lung revealed no leakage from the staple lines of  clinical  significance.  The pleura then was stripped from the apex of  the chest cavity.  The remainder of pleura was lightly abraded to  promote adhesion formation.  A 28-French chest tube was placed through a  separate incision and secured with a 0-silk suture.  The remaining port  incisions were closed with #1 Vicryl fascial sutures followed by 3-0  Vicryl subcuticular sutures.  All sponge, needle, and instrument counts  were correct at the end of the procedure.  The patient was taken from  the operating room to the Postanesthetic Care Unit in good condition.      Salvatore Decent [name], M.D.  Electronically Signed     SCH/MEDQ  D:  01/11/2009  T:  01/12/2009  Job:  [id number]   cc:   Titus Dubin. Alwyn Ren, MD,FACP,FCCP

## 2011-03-31 NOTE — Assessment & Plan Note (Signed)
OFFICE VISIT   Desiree Byrd, Desiree Byrd  DOB:  09-10-1951                                        January 24, 2009  CHART #:  [id number]   The patient is a 60 year old woman who presented with a recurrent  spontaneous pneumothorax about 2 weeks ago.  She developed symptoms on  Monday, came in on Thursday, and had about 20% left-sided pneumothorax.  She has had a previous pneumothorax on this side 10 years previously.  We recommended surgery and went ahead with left VATS, blebectomy, and  pleural abrasion.  Interestingly, she did not have classic apical blebs.  She had some inflammatory changes in the lungs.  Several of these areas  were biopsied and she had a small blistery bleb that was was actively  leaking air  and resected.  She had no air leak postoperatively and went  home on postoperative day #3 or 4 without any complications.  She has  been doing well.  Since that time, she complained of some pain and  fullness sensation in the left subcostal region.  She also complains  that her exercise tolerance is diminished relative to preoperatively.  She is anxious to resume activities including playing tennis, jogging,  and exercising.   PHYSICAL EXAMINATION:  GENERAL:  The patient is a 60 year old woman, in  no acute distress.  VITAL SIGNS:  Blood pressure is 115/80, pulse 97, respirations are 18.  Her ox saturation is 98% on room air.  LUNGS:  Clear with equal breath sounds bilaterally.  Her incisions are  healing well with no signs of infection.   Chest x-ray shows good aeration of the left lung.  There is a relatively  large gastric bleb.  There is some plate-like atelectasis in the left  lung.   IMPRESSION:  The patient is doing very well at this point in time.  She  is about 2 weeks out from surgery.  I encouraged her to avoid any heavy  straining maneuvers such as lifting very heavy weights or other things  that would create a Valsalva-type effect.  Other than  that her  activities are essentially unrestricted.  She may drive, engage in light  exercise including light tennis.  I told her to expect to be probably at  least another month before she has fully recovered and recovers her  stamina.  I am going to plan to see her back in about 6 weeks.  She is  to check a chest x-ray and make sure the remainder of this plate-like  atelectasis is resolving.  It does look improved compared to her last  film on the hospital.   Viviann Spare C. [name], M.D.  Electronically Signed   SCH/MEDQ  D:  01/24/2009  T:  01/24/2009  Job:  [id number]   cc:   Titus Dubin. Alwyn Ren, MD,FACP,FCCP

## 2011-04-03 NOTE — Discharge Summary (Signed)
[place]. [facility]  Patient:    Desiree Byrd, Desiree Byrd                           MRN: [id number] Adm. Date:  [id number] Disc. Date: 05/21/00 Attending:  Charlett Lango Dictator:   Sherrie George, P.A. CC:         Sherrie George, P.A.             Hewitt Shorts, M.D.             Danae Orleans. Venetia Maxon, M.D.                           Discharge Summary  DATE OF BIRTH:  [birth year].  ADMISSION DIAGNOSES: 1. Spontaneous left pneumothorax May 18, 2000. 2. Status post discectomy x 2, May 06, 2000, by Hewitt Shorts, M.D.  DISCHARGE DIAGNOSES: 1. Spontaneous left pneumothorax May 18, 2000. 2. Status post discectomy x 2, May 06, 2000, by Hewitt Shorts, M.D.  PROCEDURE:  Insertion of left chest tube on May 18, 2000.  BRIEF HISTORY:  The patient is a 60 year old white female surgical patient of Dr. Shirlean Kelly who is status post discectomy x 2, 12 days prior to admission.  She presented on the evening of admission with complaints of chest discomfort and shortness of breath.  Chest x-ray revealed an 80% left pneumothorax.  She had developed progressive symptoms over the 48 hours prior to admission.  After being seen in the ER and diagnosis was made, she was seen in consultation by Salvatore Decent. [name], M.D.  He recommended placement of a chest tube for correction of her pneumothorax.  The risks and benefits were discussed in detail and informed operative consent was obtained.  PAST MEDICAL HISTORY:  This includes discectomy x 2.  The last surgery was 12 days ago by Dr. Newell Coral. She had a facial lesion surgically corrected in the past.  She denies any major illness.  Specifically denies hypertension, diabetes, coronary artery disease, asthma, COPD, cancer, urinary difficulties, GI difficulties, thyroid problems or liver problems.  SOCIAL HISTORY:  She is married.  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  She is a nonsmoker and occasionally uses  alcohol.  MEDICATIONS ON ADMISSION: 1. Vioxx 25 mg q.d. 2. Neurontin 300 mg p.o. t.i.d. for pain related to her back surgery.  HOSPITAL COURSE:  The patient was seen in the ER and a #28 chest tube was placed by Dr. [name] at that time.  The pneumothorax improved.  She was down to 15% pneumothorax by May 19, 2000.  Follow-up chest x-ray on May 20, 2000, was seen by Dr. [name] and he ordered the chest tube be removed.  The patient has a chest x-ray pending at this time and again in the a.m. if the patient remains stable with no recurrence of her pneumothorax, we plan to discharge her home in the a.m. of May 21, 2000.  During her hospital course the patient has used a fair amount of morphine and Percocet for pain. Morphine has been discontinued after chest tube removal.  If she does well and chest x-ray shows no further recurrence for pneumothorax, we will discharge in the a.m. of May 21, 2000.  DISCHARGE MEDICATIONS: 1. She will resume her preadmission Neurontin 300 mg t.i.d., and Vioxx 25 mg    q.d. as before. 2. She will also be given Percocet  one to two p.o. q.4h. p.r.n. for pain.  DISCHARGE INSTRUCTIONS:  She is instructed to walk daily, not to lift over 10 pounds, drive a car or do anything strenuous.  CONDITION ON DISCHARGE:  Improving.  At the time of dictation chest x-ray is also pending in the a.m. and after the chest tube removal. DD:  05/20/00 TD:  05/20/00 Job: [id number]

## 2011-04-03 NOTE — H&P (Signed)
[place]. [facility]  Patient:    Desiree Byrd, Desiree Byrd                             MRN: [id number] Adm. Date:  [id number] Attending:  Barton Fanny CC:         Hewitt Shorts, M.D.                         History and Physical  HISTORY OF PRESENT ILLNESS:  The patient is a 60 year old right-handed white female who was evaluated for a right lumbar radiculopathy related to a right L5-S1 lumbar disk herniation.  She first began to have difficulties over 2-1/2 years ago with some pain in the right buttock that she would notice when she would change from a seated to a standing position.  She was treated for piriformis syndrome.  She subsequently developed numbness in the distal aspect of the sole of her right foot involving the ball of the right foot and in the lateral four toes.  She underwent a tarsal tunnel decompression and improved; however, the right buttock pain persisted.  An MRI scan was done and revealed disk herniation and the patient has undergone physical therapy, both for her right ankle as well as for her low back.  She underwent a series of three epidural steroid injections in October 1999.  They helped quite a bit but the benefit only lasted for two to three months.  She underwent a second series of three epidural steroid injections in March 2000.  These helped somewhat less. At times, she has had to limit her activities, reducing the amount of tennis that she plays and the amount of workouts that she does.  She finds that when she plants her right foot for a backhand, the right lower extremity does feel somewhat weak, as compared to when she plants her left foot for a forehand, but she describes no specific sense of weakness.  In addition to the buttock pain, she will have an electric current-like pain that will run down to the right buttock, posterior thigh, and calf, and she still has pain when she changes from a seated to a standing position and  also has pain when she lays down flat on her back.  She has minimal numbness and tingling in the right calf and persistent numbness in the sole of her right foot distally from the tarsal tunnel syndrome.  She has been treated with a variety of anti-inflammatory medications including Advil, Aleve, and Vioxx and currently she uses Vioxx, as well as Neurontin 300 mg t.i.d.  An MRI scan was repeated earlier this year and compared to the August 1999 study.  There was significant interval change in the year and a half between the two studies. She has degenerative disk disease with desiccation of the disks at L4-5 and L5-S1 with a mild disk protrusion in a broad-based fashion at L4-5, which is unchanged; however, at the L5-S1 level, where she had a previously small right L5-S1 lumbar disk herniation with a posterior displacement of the right S2 nerve root, a year and a half later she now has a large right L5-S1 lumbar disk herniation with significant right S1 nerve root compression.  The significant change is that of the disk herniation on the right side at L5-S1, enlarging substantially.  We first evaluated her in September 2000.  She returned for re-evaluation in February 2001.  She continues to complain of pain from her right buttock down through the right posterior thigh and into the sole of her right foot with burning and tightness.  She has little in the way of midline low back pain. She does describe some numbness in the sole of her right foot.  She does not appreciate any specific weakness.  The Neurontin that was started in January 2001 does take the edge off her pain and she notices today, when she is admitted for surgery and did not take her Vioxx 25 mg, that she is having more pain.  The patient is admitted now for a right L5-S1 lumbar laminotomy and microdiskectomy.  PAST MEDICAL HISTORY:  History of gastritis, treated with Prilosec.  She does not describe any history of  hypertension, myocardial infarction, cancer, stroke, diabetes, or lung disease.  PAST SURGICAL HISTORY:  Removal of parenchymal ______ cyst on the left side of her neck in 1990.  Right knee arthroscopy by Dr. Eulah Pont in 1995.  Right tarsal tunnel release and removal of cyst in July 1999 by Dr. Eulah Pont.  ALLERGIES:  She denies allergies to medications.  CURRENT MEDICATIONS: 1. Vioxx 25 mg q.d. 2. Neurontin 300 mg t.i.d.  FAMILY HISTORY:  Her father has passed on.  He had multiple sclerosis.  Her mother is in good health at age 67 with hypertension.  SOCIAL HISTORY:  The patient is married.  She does not smoke.  She drinks alcoholic beverages socially.  She denies history of substance abuse.  REVIEW OF SYSTEMS:  Notable for those difficulties described in the history of present illness and past medical history but is otherwise unremarkable.  PHYSICAL EXAMINATION:  GENERAL:  The patient is a well-developed, well-nourished white female in no acute distress.  VITAL SIGNS:  Temperature 98.7, pulse 74, blood pressure 108/75, respiratory rate 16, height 5 feet 8 inches, weight 155 pounds.  LUNGS:  Clear to auscultation.  She has symmetrical respiratory excursion.  HEART:  Regular rate and rhythm.  Normal S1, S2.  There is no murmur.  ABDOMEN:  Soft, nondistended.  Bowel sounds are present.  EXTREMITIES:  No clubbing, cyanosis, or edema.  VASCULAR:  Mild lower extremity venous varicosities.  Pulses are 1-2 in the posterior dorsalis pedis bilaterally.  MUSCULOSKELETAL:  No tenderness to palpation of the lumbar spine.  ______ musculature.  She is limited in forward flexion to about 45 degrees and at that point she has pain that shoots down to her right buttock.  She has discomfort into the right buttock with extension as well.  Straight leg raising is positive on the right at 45 degrees with pain into the right buttock.  She has a positive ______ raising on the left at 90 degrees  with pain into the right buttock.  NEUROLOGIC:  5/5 strength in the dorsiflexors and plantar flexors bilaterally,  as well as the left extensor hallucis longus; however, the right extensor hallucis longus 4+-5/5.  Sensory examination:  She has intake sensation to pinprick.  Reflexes are 2 at the quadriceps and 1 at the gastrocnemii that is symmetric bilaterally.  Toes are downgoing bilaterally.  She has a normal gait and stance.  IMPRESSION:  Right L5-S1 lumbar disk herniation superimposed upon underlying degenerative disk disease.  PLAN:  The patient will be admitted for a right L5-S1 lumbar laminotomy and microdiskectomy.  We discussed the nature of surgery, typical length of surgery, hospital stay and overall recuperation, and limitations during the postoperative period and risks of surgery including  risk of infection and bleeding; possible need for transfusion; the risk of nervous dysfunction with pain, weakness, numbness or paresthesias; the risk of recurrent disk herniation; possible need for further surgery; and anesthetic risks of myocardial infarction, stroke, pneumonia, and death.  We also discussed the risk of advancing degenerative disk disease and possible need for further surgery.  Understanding all this, the patient does wish to proceed with surgery and is admitted for such. DD:  03/10/00 TD:  03/10/00 Job: [id number]

## 2011-04-03 NOTE — Op Note (Signed)
[place]. [facility]  Patient:    Desiree Byrd, Desiree Byrd                             MRN: [id number] Proc. Date: 05/06/00 Adm. Date:  [id number] Disc. Date: [id number] Attending:  Barton Fanny CC:         Hewitt Shorts, M.D.                           Operative Report  PREOPERATIVE DIAGNOSIS:  Right L5-S1 recurrent lumbar disk herniation.  POSTOPERATIVE DIAGNOSIS:  Right L5-S1 recurrent lumbar disk herniation.  PROCEDURE:  Right L5-S1 lumbar laminotomy and microdiskectomy with microdissection and the operating microscope.  SURGEON:  Hewitt Shorts, M.D.  ASSISTANT:  Cristi Loron, M.D.  ANESTHESIA:  General endotracheal.  INDICATIONS:  Patient is a 60 year old white female, eight weeks status post right L5-S1 lumbar disk herniation.  She suffered a recurrent radiculopathy, which was found by MRI scan having a recurrent disk herniation. Decision made to proceed with elective laminotomy and microdiskectomy.  PROCEDURE:  Patient brought to the operating room and placed under general endotracheal anesthesia.  Patient was turned to a prone position, lumbar region was prepped with Betadine soap and solution and draped in a sterile fashion.  The previous midline incision was opened after the underlying tissues were infiltrated with local anesthetic with epinephrine.  Dissection was carried down through the subcutaneous tissue, bipolar cautery and electrocautery were used to maintain hemostasis.  Dissection was carried down to the lumbar fascia, which was incised from the right side of the midline and the paraspinous muscle was dissected from the spinous process and lamina in a subperiosteal fashion.  The previous laminotomy was identified and the scar tissue within the laminotomy was carefully dissected and the microscope was draped and brought into the field to provide instrument magnification and illumination and visualization.  The remainder of  the procedure was performed using microdissection technique.  We quickly identified free fragment of disk herniation within the spinal canal.  This was carefully freed from the surrounding epidural tissues and several large fragments of recurrent disk herniation were removed.  We then entered into the disk space and removed additional degenerated disk material and all loose fragments of disk material were removed from both the disk space and the epidural space and good decompression of the thecal sac and nerve root were achieved.  We examined the epidural space, both ventral to the thecal sac, as well as, ventral to the nerve root following the nerve root into the foramen and no residual compression was found, no additional fragments of disk herniation were found and it was felt that good decompression had been achieved.  We then irrigated the wound extensively with bacitracin solution, checked for hemostasis once again and when it was established and confirmed, we infused 2 cc of fentanyl and 80 mg of Depo Medrol into the epidural space and proceeded with closure. The deep fascia closed with interrupted, undyed 0 Vicryl sutures and the subcutaneous and subcuticular closed with interrupted, inverted 2-0 undyed Vicryl sutures and the skin edges were approximated with Dermabond.  The patient tolerated the procedure well.  The estimated blood loss was less than 25 cc.  Sponge and needle counts were correct.  Following surgery, the patient was to be turned back to a supine position to be reversed from anesthetic and extubated and transferred to  the recovery room for further care. DD:  05/06/00 TD:  05/09/00 Job: [id number]

## 2011-04-03 NOTE — H&P (Signed)
[place]. [facility]  Patient:    Desiree Byrd, Desiree Byrd                             MRN: [id number] Adm. Date:  [id number] Disc. Date: [id number] Attending:  Loura Halt Ii CC:         Hewitt Shorts, M.D.                         History and Physical  HISTORY OF PRESENT ILLNESS:  The patient is a 60 year old right-handed white female, a patient of mine, who is about 8 weeks status post a right L5-S1 lumbar laminotomy and microdiskectomy.  She did well initially from that surgery and was gradually increasing her activities but developed about 5 weeks postoperatively, a recurrent radicular pain, with burning and discomfort from the right side of the low back down to the right buttock, thigh, and leg, with numbness in the right foot, and the recurrence of symptoms may have been associated with her suddenly increasing activity.  She is continued on Vioxx and neurontin taking 600 mg t.i.d. but at this point Percocet has been necessary to control the pain.  IMAGING STUDIES:  An MRI scan reveals a large recurrent disk herniation on the right side at L5-S1 with significant thecal sac and nerve root compression.  We discussed options for further treatment including laminotomy, microdiskectomy for her current disk herniation versus a lumbar fusion.  She certainly would like to proceed with the more limited procedure but she understands that if she suffers another early recurrence that she will require lumbar fusion.  We discussed the nature of surgery, and its risk, including the added risk of this being a reoperation including evidence of infection, bleeding, possible need for transfusion, the risk of nerve root dysfunction with pain, weakness, numbness and paresthesia, the risk of dural tear and CSF leakage and the risk of recurrence disk herniation as well anesthetic risks of myocardial infarction, stroke, pneumonia, death.  After discussing this all thoroughly the  patient does wish to proceed with surgery and is admitted for such.  PAST MEDICAL HISTORY:  Unchanged since her March 10, 2000, admission other than for the lumbar surgery done that day.  FAMILY HISTORY/ SOCIAL HISTORY/ AND REVIEW OF SYSTEMS:  Unchanged.  PHYSICAL EXAMINATION:  GENERAL:  Reveals a well-developed well-nourished white female who is clearly in discomfort.  VITAL SIGNS:  Temperature 98.6, pulse 90, blood pressure 142/76, respiration rate 16, height 5 foot 8 inches, weight 165 pounds.  LUNGS:  Clear to auscultation.  She has symmetrical respiratory excursion.  HEART:  Regular rate and rhythm, normal S1, S2.  There is no murmur.  ABDOMEN:  Soft, nontender, bowel sounds are present.  EXTREMITIES:  No clubbing, cyanosis, or edema.  NEUROLOGIC:  Shows 5/5 strength in the dorsiflexors and plantar flexors bilaterally as well as in the left extensor hallucis longus.  However, the right extensor hallucis longus is 4+ to 5/5.  Reflexes:  The quadriceps are 1-2 bilaterally in left gastrocnemius and the one on the right is absent.  IMPRESSION:  Recurrent right L5-S1 lumbar disc herniation.  PLAN:  The patient will be admitted for a right L5-S1 lumbar laminectomy and microdiskectomy for recurrent disk herniation.  We have discussed the nature of surgery, alternatives to surgical procedures, the risk of surgery and her limitations during the postoperative period.  Understanding all of that the patient  does wish to proceed with surgery and is admitted for such. DD:  05/06/00 TD:  05/06/00 Job: [id number]

## 2011-04-03 NOTE — H&P (Signed)
[place]. [facility]  Patient:    Desiree Byrd, Desiree Byrd                           MRN: [id number] Adm. Date:  [id number] Attending:  Charlett Byrd Dictator:   Desiree Byrd, P.A.-C. CC:         CVTS Office             Desiree Byrd, M.D.                         History and Physical  DATE OF BIRTH:  11-07-1951  CHIEF COMPLAINT:  Acute onset shortness of breath.  HISTORY OF PRESENT ILLNESS:  This is a pleasant, 60 year old white female with no previous history of major medical problems admitted from the emergency room with an 80% left pneumothorax.  She initially noticed vague left sided chest discomfort and shortness of breath when she woke up Monday morning, May 17, 2000. She complained of her left chest being "sore" and it was worse with lying down or movement.  She initially thought she had "slept on it wrong." Her symptoms gradually worsened. By Tuesday morning her discomfort was worse and she had increasing shortness of breath.  She called Desiree Byrd who had operated on her recently for her back.  She was advised to go to the emergency room.  She eventually presented to the emergency room Tuesday night.  She was in no respiratory distress.  Chest x-ray at that time revealed an 80% left pneumothorax. There were no signs of tension pneumothorax.  CVTS was consulted and Desiree Byrd evaluated Desiree Byrd. He concluded that chest tube was indicated.  She remained stable throughout with no respiratory distress.  PAST MEDICAL HISTORY: 1. L5 diskectomy times two, most recent surgery 12 days ago by Desiree Byrd. 2. Facial lesion surgically corrected in the past. 3. Denies major illness, specifically denies hypertension, diabetes, coronary    artery disease, asthma, COPD, cancer, urinary difficulties, GI    difficulties, thyroid problems, or liver problems.  SOCIAL HISTORY: She is married.  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  She is a nonsmoker.  She  occasionally uses ETOH.  FAMILY HISTORY:  Father deceased secondary to multiple sclerosis. Mother is alive with hypertension.  She has two daughters ages 49 and 83 alive and well.  MEDICATIONS:  She takes Vioxx 25 mg p.o. q.d. and Neurontin 300 mg p.o. t.i.d. for pain related to her back surgery.  ALLERGIES:  No known drug allergies.  REVIEW OF SYSTEMS:  She denies history of stroke, TIA, chest pain, prior shortness of breath, cardiac complaints, previous pulmonary complaints, hepatic or renal complaints.  OBJECTIVE DATA: Labs show sodium 136, potassium 3.8, chloride 103, CO2 26, BUN 11, creatinine 0.7, glucose 120.  WBC 6.7, hemoglobin 11.7, hematocrit 34.9, platelets 280.  PT 13.2, PTT 26, INR 1.0.  Electrocardiogram is normal.  Chest x-ray shows 80% left pneumothorax.  PHYSICAL EXAMINATION:  VITAL SIGNS:  On admission 99% pulse oximetry on room air, blood pressure 130/96, pulse 104 and regular, respirations 18, temperature 99.1.  GENERAL:  Pleasant white female who appears her stated age, well-nourished, well-developed in no acute distress.  She was alert and oriented times three.  NEUROLOGIC:  She is neurologically grossly intact.  CHEST: Reveals decreased breath sounds on the left side. She is in no respiratory distress.  NECK:  Shows trachea is  midline.  There is no adenopathy. Carotids are 2+ bilateral with no bruits.   The neck is supple.  CARDIAC: Shows regular rate and rhythm.  S1 and S2 with no rub, murmur or gallop.  ABDOMEN: Soft, nontender, nondistended with positive bowel sounds. There are no abdominal bruits auscultated.  EXTREMITIES: Show normal 2+ distal pulses in the upper and lower extremities. She has full range of motion.  Good muscular tone and +5/5 strength throughout.  There are no skin changes.  Extremities appear well perfused.  ASSESSMENT/PLAN:  This is a 60 year old female, nonsmoker presenting with a primary spontaneous left pneumothorax.   Chest tube is indicated and is to be inserted by Desiree Byrd.  She is to be admitted with her chest tube in at least 24 hours with subsequent discontinuation when her air leak is resolved. She is considered stable and is admitted to unit 5700 with routine care. Chest x-ray will be followed daily while chest tube is in place.  She will be provided analgesia. DD:  05/19/00 TD:  05/19/00 Job: [id number]

## 2011-04-03 NOTE — Op Note (Signed)
[place]. [facility]  Patient:    TINEA, NOBILE Visit Number: [id number] MRN: [id number]          Service Type: SUR Location: [id number] Attending Physician:  Barbee Cough Dictated by:   Kinnie Scales Annalee Genta, M.D. Proc. Date: 04/12/02 Admit Date:  04/12/2002 Discharge Date: 04/13/2002   CC:         Titus Dubin. Alwyn Ren, M.D. [facility]   Operative Report  PREOPERATIVE DIAGNOSIS:  Left thyroid mass.  POSTOPERATIVE DIAGNOSIS:  Left thyroid mass.  OPERATION PERFORMED:  Left hemithyroidectomy.  SURGEON:  Kinnie Scales. Annalee Genta, M.D.  ANESTHESIA:  General endotracheal with nerve integrity monitor system (NIMS).  ASSISTANT:  Veverly Fells. Arletha Grippe, M.D.  ESTIMATED BLOOD LOSS:  Less than 50 cc.  COMPLICATIONS:  None.  The patient was transferred from the operating room to the recovery room in stable condition.  INDICATIONS FOR ASSISTANT SURGEON:  The patient had a complex thyroid mass at the inferior aspect of the thyroid gland necessitating surgical expertise of a second surgeon as an Geophysicist/field seismologist.  INDICATIONS FOR PROCEDURE:  The patient is a 60 year old white female who was referred for evaluation of a gradually enlarging mass involving the left thyroid lobe.  Ultrasonography was performed which showed approximately a 2 cm complex cystic/solid mass in the inferior aspect of the left thyroid gland. The remainder of the gland appeared normal without evidence of nodularity or mass.  The patients thyroid function tests were normal.  Fine needle aspiration was performed which was read by the pathologist as a benign process with no evidence of malignancy.  Given the patients history, examination and findings, I recommended left hemithyroidectomy for excision of the left thyroid lobe and definitive pathologic diagnosis.  The risks, benefits, and possible complications of the surgical procedure were discussed in detail with the patient and her husband, who understood  and concurred with our plan for surgery which was scheduled for Apr 12, 2002.  DESCRIPTION OF PROCEDURE:  The patient was brought to the operating room on Apr 12, 2002 and placed in supine position on the operating table.  General endotracheal anesthesia using a NIMS endotracheal tube was established without difficulty.  Bilateral vocal cord stimulation was documented with appropriate positioning of the NIM tube.  The patient was prepped and draped in sterile fashion.  She was injected with 4 cc of 1% lidocaine with 1:100,000 dilution of epinephrine injected in a subcutaneous fashion, along the anterior neck skin in the previous skin crease.  After allowing adequate time for vasoconstriction.  Surgical procedure was begun by creating an approximately 6 cm curvilinear incision in a previous skin crease low in the anterior neck. This was carried through the skin and underlying subcutaneous tissue. Platysma muscle was identified in the lateral aspect of the incision.  This was divided and subplatysmal flaps were elevated superiorly and inferiorly to allow access to the anterior neck.  Strap muscles were identified in the midline, the midline was divided using electrocautery and strap muscles were divided and lateralized allowing direct access to the anterior aspect of the patients airway and thyroid gland bilaterally.  The patient had a large approximately 2 x 3 cm firm nodular mass pedunculated on the inferior aspect of the left thyroid lobe.  The right thyroid lobe appeared normal to palpation and visualization.  Left hemithyroidectomy was then undertaken beginning with further dissection of the strap muscles and lateralization.  The middle thyroid vein was identified and suture ligated.  The patients thyroid gland  was then elevated from lateral to medial and this allowed dissection along the posterior and lateral aspects of the thyroid gland pedicled along the superior inferior thyroid  pedicle.  Dissection was then carried out along the anterior airway dissecting the thyroid isthmus.  Dissection was carried along the inferior aspect of the gland into the tracheoesophageal groove.  The inferior thyroid pedicle was identified and artery and vein were divided and suture ligated.  The parathyroid gland was identified in the inferior aspect of the dissection site and this was reflected inferiorly and preserved throughout the remainder of the surgery.  The recurrent laryngeal nerve was identified.  The overlying tissue was divided and the nerve was followed by proximal to distal, reflecting the gland medially and following the nerve to its insertion at the level of the patients larynx.  The nerve integrity monitor system was used throughout the case and nerve stimulation was documented at 0.3 milliamps at the conclusion of the procedure.  Throughout the dissection there was no evidence of recurrent laryngeal nerve injury.  Dissection was then carried out on the superior pole of the thyroid gland.  The left thyroid lobe and mass were reflected inferiorly, traction maintained on the thyroid lobe and dissection carried out along the superior aspect identifying the superior laryngeal nerve and dividing and suture ligating the superior thyroid pedicle, artery and vein separately ligated.  With the gland freely mobilized, the isthmus of the thyroid was divided and suture ligated with 2-0 chromic suture. The left thyroid lobe was then dissected along Berrys ligament off the lateral aspect of the trachea.  Again, the recurrent laryngeal nerve was identified and visualized throughout this portion of the procedure and preserved.  The mass was then sent to pathology for gross and microscopic evaluation.  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  No evidence of malignancy.  Final diagnosis deferred to permanent section.  With the left lobe of the thyroid gland  removed the wound bed was  thoroughly irrigated with saline solution.  There were several areas of point hemorrhage which were cauterized using bipolar cautery, several areas that were suture ligated.  No active bleeding was noted.  A 7 mm Blake drain was placed in the depth of the wound and this was carried out through a separate stab incision in the anterior inferior neck.  The wound was then closed in multiple layers consisting of reapproximation of strap muscles with a 3-0 Vicryl suture.  The platysma muscle was reapproximated in interrupted fashion with a 4-0 Vicryl suture as was the deep subcutaneous tissues.  Final skin closure was achieved with a 5-0 Ethilon suture in a running locked fashion. The patients wound was then dressed with bacitracin ointment.  She was awakened from anesthetic, extubated and was transferred from the operating room to the recovery room in stable condition.  There were no complications. Estimated blood loss was approximately 50 cc. Dictated by:   Kinnie Scales. Annalee Genta, M.D. Attending Physician:  Barbee Cough DD:  04/12/02 TD:  04/13/02 Job: [id number]

## 2011-04-03 NOTE — Op Note (Signed)
[place]. [facility]  Patient:    Desiree Byrd, Desiree Byrd                             MRN: [id number] Proc. Date: 03/10/00 Adm. Date:  [id number] Attending:  Barton Fanny Dictator:   Hewitt Shorts, M.D. CC:         Hewitt Shorts, M.D.                           Operative Report  PREOPERATIVE DIAGNOSIS:  Right L5-S1 lumbar disc herniation.  POSTOPERATIVE DIAGNOSIS:  Right L5-S1 lumbar disc herniation.  PROCEDURE:  Right L5-S1 lumbar laminotomy and microdiskectomy with microdissection.  SURGEON:  Dr. Newell Coral.  ASSISTANT:  Dr. Franky Macho.  ANESTHESIA:  General endotracheal anesthesia.  INDICATIONS:  The patient is a 60 year old woman who is suffered from a right lumbar radiculopathy for 2-1/2 years who was found by MRI scan to have a large right L5-S1 lumbar disc herniation.  A decision was made to proceed with elective laminotomy and microdiskectomy.  DESCRIPTION OF PROCEDURE:  The patient was brought to the operating room, placed under general endotracheal anesthesia.  The patient was turned to a prone position.  The lumbar region was prepped with Betadine solution and draped in a sterile fashion.  Localizing x-rays were taken, the L5-S1 level identified.  The midline was injected with local anesthetic with epinephrine and a midline incision was made, carried down through the subcutaneous tissue. Bipolar cautery and electrocautery were used to maintain hemostasis. Dissection was carried down to the lumbar fascia which was incised on the right side of the midline, and the paraspinous muscles dissected from the spinous process and lamina in a subperiosteal fashion.  The L5-S1 interlaminar space was identified.  This was confirmed with x-ray, and then a laminotomy was performed using the Midas Rex drill with an AM8 burr and Kerrison punches. The ligamentum flavum was removed, and the underlying thecal sac and right S1 nerve root were identified.  A  foraminotomy was performed for the right S1 nerve root, and the nerve root itself was quite swollen and projected posteriorly.  The microscope was draped and brought into the field to provide magnification illumination and visualization.  The remainder of the procedure was performed using microdissection technique.  A medial fascitectomy was performed to increase our lateral exposure around the nerve root, and then it was gently mobilized medially, exposing a large disc herniation.  The thin amount of retaining fibers were incised, and the extruded disc fragment removed.  The disc was quite degenerated.  We coagulated the tissues overlying the disc space.  We examined the epidural space.  It was felt that good decompression of the thecal sac and nerve root had been achieved, and it was decided not to enter the disc space.  It was felt that all loose fragments and disc material had been removed, and good decompression had been achieved. Hemostasis was established with the use of bipolar cautery, as well as Gelfoam soaked in thrombin, however, all the Gelfoam was removed prior to closure. Once hemostasis was achieved, 2 cc of phentanyl and 80 mg of Depo-Medrol were infused into the epidural space around the nerve root and thecal sac, and then the wound was closed in multiple layers.  The deep fascia was closed with interrupted undyed 0 Vicryl suture, the subcutaneous and subcuticular closure with inverted 3-0 Vicryl suture, and  the skin edges were approximated with Dermabond.  The patient awoke, tolerated the procedure well.  Sponge and instrument counts were correct.  Following surgery, the patient was turned back to a supine position, reversed from anesthetic, extubated, and transferred to the recovery room for further care.  ESTIMATED BLOOD LOSS:  Less than 25 cc. DD:  03/10/00 TD:  03/10/00 Job: [id number]

## 2011-04-03 NOTE — Op Note (Signed)
[place]. [facility]  Patient:    Desiree Byrd, Desiree Byrd                             MRN: [id number] Proc. Date: 03/15/00 Adm. Date:  [id number] Disc. Date: [id number] Attending:  Loura Halt Ii                           Operative Report  PREOPERATIVE DIAGNOSIS: 1. A 2 mm left nasal alar nevus. 2. A 3 mm left chin scar. 3. An 8 mm right shoulder nevus.  POSTOPERATIVE DIAGNOSIS: 1. A 2 mm left nasal alar nevus. 2. A 3 mm left chin scar. 3. An 8 mm right shoulder nevus.  OPERATION PERFORMED: 1. Excision of left nasal alar nevus with primary closure. 2. Excision of left chin ice pick scar with primary closure. 3. Excision of right shoulder nevus.  SURGEON:  Alfredia Ferguson, M.D.  ANESTHESIA:  2% Xylocaine 1:100,000 epinephrine.  INDICATIONS FOR PROCEDURE:  This is a 60 year old woman who complains of a slowly enlarging lesion on her left nasal ala and also one on her right shoulder.  The one on her right shoulder is in the way of her straps and she wishes to have it removed.  The patient also has an ice pick scar in the left chin area which she desires to have removed.  She understands she will be trading what she has for permanent and potentially unsightly scar.  In spite of that she wishes to proceed with the surgery.  DESCRIPTION OF PROCEDURE:  Skin markers were placed around the left nasal alar lesion, the right shoulder lesion and the left chin lesion and local anesthesia was infiltrated.  The areas were prepped and draped in a sterile fashion.  An elliptical excision of the left nasal alar lesion was carried out and the lesion was passed off for pathology.  The wound edges were undermined for a distance of 1 to 2 mm and the skin was closed using interrupted 6-0 nylon suture.  The left chin lesion was then excised in elliptical fashion. The specimen was not sent for pathologyl.  The wound was closed after undermining the edges for a distance of 2 to  3 mm by uniting the edges using interrupted 6-0 nylon suture.  Finally, the right shoulder lesion was excised in elliptical fashion.  Specimen was passed off for pathology.  Wound edges were undermined for a distance of 3 to 4 mm.  The dermis was closed using interrupted 4-0 Vicryl suture.  The skin was united using a running 5-0 nylon suture.  Light dressings were applied in all areas.  The patient was discharged home in satisfactory condition. DD:  03/16/00 TD:  03/18/00 Job: [id number]

## 2011-04-17 ENCOUNTER — Encounter: Payer: Self-pay | Admitting: Internal Medicine

## 2011-04-17 ENCOUNTER — Ambulatory Visit (INDEPENDENT_AMBULATORY_CARE_PROVIDER_SITE_OTHER): Payer: PRIVATE HEALTH INSURANCE | Admitting: Internal Medicine

## 2011-04-17 VITALS — BP 110/76 | HR 80 | Temp 98.5°F | Wt 154.4 lb

## 2011-04-17 DIAGNOSIS — J209 Acute bronchitis, unspecified: Secondary | ICD-10-CM

## 2011-04-17 DIAGNOSIS — J930 Spontaneous tension pneumothorax: Secondary | ICD-10-CM

## 2011-04-17 MED ORDER — HYDROCODONE-HOMATROPINE 5-1.5 MG/5ML PO SYRP: 5.0000 mL | ORAL_SOLUTION | Freq: Four times a day (QID) | ORAL | Status: AC | PRN

## 2011-04-17 MED ORDER — MOXIFLOXACIN HCL 400 MG PO TABS: 400.0000 mg | ORAL_TABLET | Freq: Every day | ORAL | Status: AC

## 2011-04-17 NOTE — Patient Instructions (Signed)
Force non dairy fluids. Stop Sudafed. Call after 4 days of Avelox as to status

## 2011-04-17 NOTE — Progress Notes (Signed)
  Subjective:    Patient ID: Desiree Byrd, female    DOB: 05/31/1951, 60 y.o.   MRN: [id number]  HPI Respiratory tract infection Onset/symptoms:8 days ago Exposures (illness/environmental/extrinsic):husband sick Progression of symptoms:worsening Treatments/response:Sudafed , NSAIDS Present symptoms:nausea, headache Fever/chills/sweats:? Fever, temp measured Frontal headache:no Facial pain:no Nasal purulence:no Sore throat:no Dental pain:no Lymphadenopathy:no Wheezing/shortness of breath:dyspnea @ rest; "can't get deep breath" Cough/sputum/hemoptysis:dry Pleuritic pain:no Associated extrinsic/allergic symptoms:itchy eyes/ sneezing:no Past medical history: Seasonal allergies/asthma:no but Spontaneous PTX X 2, S/P pleurodesis 2009 Smoking history:never FH: brother CF  She has annual flu shot & had Pneumovax in 2009        Review of Systems     Objective:   Physical Exam General appearance is of good health and nourishment; no acute distress or increased work of breathing is present.  No  lymphadenopathy about the head, neck, or axilla noted.   Eyes: No conjunctival inflammation or lid edema is present. There is no scleral icterus.  Ears:  External ear exam shows no significant lesions or deformities.  Otoscopic examination reveals clear canals, tympanic membranes are intact bilaterally without bulging, retraction, inflammation or discharge.  Nose:  External nasal examination shows no deformity or inflammation. Nasal mucosa are pink and moist without lesions or exudates. No septal dislocation or dislocation.No obstruction to airflow.   Oral exam: Dental hygiene is good; lips and gums are healthy appearing.There is no oropharyngeal erythema or exudate noted.   Neck:  No deformities, thyromegaly, masses, or tenderness noted.   Supple with full range of motion without pain.   Heart:  Normal rate and regular rhythm. S1 and S2 normal without gallop, murmur, click, rub or other  extra sounds.   Lungs:Chest clear to auscultation; no wheezes, rhonchi,rales ,or rubs present.No increased work of breathing. Palpation & auscultatory exams normal . Harsh, brassy   cough Extremities:  No cyanosis, edema, or clubbing  noted    Skin: Warm & dry w/o jaundice or tenting.          Assessment & Plan:  #1 Bronchitis in context  Of PMH of PTX X 2                                                                                                                                                             Plan : see Orders & Recommendations

## 2011-09-09 ENCOUNTER — Telehealth: Payer: Self-pay | Admitting: Internal Medicine

## 2011-09-09 MED ORDER — ZOSTER VACCINE LIVE 19400 UNT/0.65ML ~~LOC~~ SOLR: 0.6500 mL | Freq: Once | SUBCUTANEOUS | Status: DC

## 2011-09-09 NOTE — Telephone Encounter (Signed)
RX sent

## 2011-09-09 NOTE — Telephone Encounter (Signed)
Patient wants rx called in for shingles [facility] - [facility]

## 2012-07-19 ENCOUNTER — Encounter: Payer: Self-pay | Admitting: Sports Medicine

## 2012-07-19 ENCOUNTER — Ambulatory Visit (INDEPENDENT_AMBULATORY_CARE_PROVIDER_SITE_OTHER): Payer: PRIVATE HEALTH INSURANCE | Admitting: Sports Medicine

## 2012-07-19 VITALS — BP 126/75 | Ht 68.0 in | Wt 160.0 lb

## 2012-07-19 DIAGNOSIS — R269 Unspecified abnormalities of gait and mobility: Secondary | ICD-10-CM

## 2012-07-19 DIAGNOSIS — M7750 Other enthesopathy of unspecified foot: Secondary | ICD-10-CM

## 2012-07-19 DIAGNOSIS — M2140 Flat foot [pes planus] (acquired), unspecified foot: Secondary | ICD-10-CM

## 2012-07-19 DIAGNOSIS — M2170 Unequal limb length (acquired), unspecified site: Secondary | ICD-10-CM | POA: Insufficient documentation

## 2012-07-19 DIAGNOSIS — M7741 Metatarsalgia, right foot: Secondary | ICD-10-CM | POA: Insufficient documentation

## 2012-07-19 NOTE — Progress Notes (Signed)
  [facility]  Patient name: Desiree Byrd MRN [id number]  Date of birth: December 28, 1950  CC & HPI:  Desiree Byrd is a 61 y.o. female presenting today for bilateral foot pain.  States L foot has been worse than R.  Has seen Dr. Eulah Pont and was given steel shank insert for shoe.    Pain has been present since January but has recently worsened to the point it is affecting her gait.  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   Pt does report occasional L knee pain and R ankle pain  ROS:  Per HPI   Objective Findings:  Vitals:  Filed Vitals:   07/19/12 1336  BP: 126/75    PE:  Foot Exam:  Mild B tenderness to palpation over plantar surfaces diffusely.  Full ROM.  Post surgical scars over R sinus tarsi.  No cyst appreciated today.  Curling of B 3,4,5 toe;  B Morton's callus, normal toe length, no splay toe B.  At neutral, metarasal prominence towards plantar surface B.  Gait Exam: slight L trendelenburg with R foot externally rotated  Leg Length: R leg ~1.25cm longer than L.   Assessment & Plan:

## 2012-07-19 NOTE — Assessment & Plan Note (Signed)
Patient was fitted for a : standard, cushioned, semi-rigid orthotic. The orthotic was heated and afterward the patient stood on the orthotic blank positioned on the orthotic stand. The patient was positioned in subtalar neutral position and 10 degrees of ankle dorsiflexion in a weight bearing stance. After completion of molding, a stable base was applied to the orthotic blank. The blank was ground to a stable position for weight bearing. Size: 9 Base: Red Posting: Blue EVA Additional orthotic padding: 1 layer of blue foam on the left orthotic, bilat small MT pads  2 pairs of orthotics were made, patient was comfortable in orthotics and walking gait was neutral.

## 2012-07-19 NOTE — Assessment & Plan Note (Addendum)
Corrected for with custom orthotics X 2  Use blue foam rubber lift to left

## 2012-07-19 NOTE — Assessment & Plan Note (Addendum)
B foot pain.  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   Hx of prior sinus tarsi syndrome s/p surgical repair X3.  Provide both longitudinal and transverse arch support with custom orthotics Added MT pads to these

## 2012-09-13 ENCOUNTER — Telehealth: Payer: Self-pay | Admitting: Internal Medicine

## 2012-09-13 NOTE — Telephone Encounter (Signed)
Patient has gone to GYN x 2 for vaginal ultrasound and had too much stool in bowel for Dr. Chevis Pretty to evaluate her ovarian cyst. He suggested she see her GI MD for the constipation. She has been taking Miralax daily which has softened the stool but still not having regular BM. She will increase the Miralax to BID. Scheduled to see Mike Gip, PA on 09/19/12 at 3:30 PM.

## 2012-09-16 ENCOUNTER — Encounter: Payer: Self-pay | Admitting: *Deleted

## 2012-09-19 ENCOUNTER — Ambulatory Visit (INDEPENDENT_AMBULATORY_CARE_PROVIDER_SITE_OTHER): Payer: PRIVATE HEALTH INSURANCE | Admitting: Physician Assistant

## 2012-09-19 ENCOUNTER — Encounter: Payer: Self-pay | Admitting: Physician Assistant

## 2012-09-19 VITALS — BP 122/70 | HR 68 | Ht 67.5 in | Wt 159.4 lb

## 2012-09-19 DIAGNOSIS — K5900 Constipation, unspecified: Secondary | ICD-10-CM

## 2012-09-19 DIAGNOSIS — K5909 Other constipation: Secondary | ICD-10-CM

## 2012-09-19 MED ORDER — LINACLOTIDE 145 MCG PO CAPS: 145.0000 ug | ORAL_CAPSULE | Freq: Every day | ORAL | Status: DC

## 2012-09-19 NOTE — Progress Notes (Signed)
Subjective:    Patient ID: Desiree Byrd, female    DOB: 04/26/1951, 61 y.o.   MRN: [id number]  HPI Desiree Byrd is a very nice 61 year old white female known to Dr. Lina Sar from prior screening colonoscopy which was done in 2006. 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 Patient states that she has of history of chronic constipation "forever". She has used a variety of laxatives over the years, but generally does not have associated abdominal pain distention etc. with her constipation. She says that she has been to her gynecologist twice for pelvic ultrasound to followup and ovarian cyst and was told that she had a lot of stool in her bowel which was interfering with the exam. Appendectomy was suggested that she have a GI appointment. She says over the past month she had been taking MiraLax once daily and had been having bowel movements but did not feel that she had really any increase in volume of stool . She called  here and now over the past week has been on MiraLax 3 times daily and is having very loose stools, but still does not feel like she has cleaned out  her bowel. Again she has  no complaint of change in her bowel habits, no abdominal discomfort, pain, bloating, distention and she has not noted any melena or hematochezia. Family history is negative for colon cancer and polyps.    Review of Systems  Constitutional: Negative.   HENT: Negative.   Eyes: Negative.   Respiratory: Negative.   Cardiovascular: Negative.   Gastrointestinal: Positive for constipation.  Genitourinary: Negative.   Musculoskeletal: Negative.   Neurological: Negative.   Hematological: Negative.   Psychiatric/Behavioral: Negative.    Outpatient Prescriptions Prior to Visit  Medication Sig Dispense Refill  . celecoxib (CELEBREX) 200 MG capsule Take 200 mg by mouth daily.        Marland Kitchen zoster vaccine live, PF, (ZOSTAVAX) 10272 UNT/0.65ML injection Inject 19,400 Units into the skin once.  1 vial  0      No  Known Allergies Patient Active Problem List  Diagnosis  . THYROID NODULE, RIGHT  . HYPERCALCEMIA  . HYPERPOTASSEMIA  . DYSPNEA/SHORTNESS OF BREATH  . HYPERGLYCEMIA  . SPONTANEOUS PNEUMOTHORAX  . Gait abnormality  . Metatarsalgia of both feet  . Leg length inequality   History  Substance Use Topics  . Smoking status: Never Smoker   . Smokeless tobacco: Never Used  . Alcohol Use: Yes    Objective:   Physical Exam well-developed white female in no acute distress, pleasant blood pressure 122/70 pulse 68 height 5 foot 7 weight 159. HEENT; nontraumatic normocephalic EOMI PERRLA sclera anicteric,;Neck; Supple no JVD, Cardiovascular; regular rate and rhythm with S1-S2 no murmur or gallop, Pulmonary ;clear bilaterally, Abdomen; soft nontender nondistended bowel sounds are active there is no palpable mass or hepatosplenomegaly, Rectal;not done, Extremities; no clubbing cyanosis or edema skin warm and dry, Psych; mood and affect normal and appropriate        Assessment & Plan:  #67 61 year old female with chronic constipation. Patient is up-to-date with colon neoplasia surveillance, negative colonoscopy 2006.  Plan; start trial of Linzess 145 mcg once daily in a.m., she was given samples today and will call back for a prescription in a week if these are helpful. In the interim she can continue MiraLax once or twice daily as needed. We discussed a bowel purge, which she is not sure she wants to do at this time however if she chooses  to do this she may use a bottle of mag citrate, or take 6-7 doses of MiraLax in one setting over 2 hours. She will followup with Dr. Lina Sar on an as-needed basis

## 2012-09-19 NOTE — Patient Instructions (Signed)
If you choose to purge your bowels you can take 6-7 doses of Miralax over 2 hours  We have given you samples of the following medication to take: Linzess, please take one tablet by mouth once daily in the morning. If this works for you, you can call Dr. Regino Schultze nurse Rene Kocher at [phone] to send in a prescription.

## 2012-09-19 NOTE — Progress Notes (Signed)
Reviewed and agree. We may get KUB next time she "feels " constipated

## 2012-09-27 ENCOUNTER — Telehealth: Payer: Self-pay | Admitting: Internal Medicine

## 2012-09-27 MED ORDER — LINACLOTIDE 145 MCG PO CAPS: 145.0000 ug | ORAL_CAPSULE | Freq: Every day | ORAL | Status: DC

## 2012-09-27 NOTE — Telephone Encounter (Signed)
rx sent

## 2012-11-25 ENCOUNTER — Other Ambulatory Visit: Payer: Self-pay | Admitting: *Deleted

## 2012-11-25 MED ORDER — LINACLOTIDE 145 MCG PO CAPS: 145.0000 ug | ORAL_CAPSULE | Freq: Every day | ORAL | Status: DC

## 2013-01-27 ENCOUNTER — Other Ambulatory Visit: Payer: Self-pay | Admitting: Internal Medicine

## 2013-02-15 ENCOUNTER — Ambulatory Visit (INDEPENDENT_AMBULATORY_CARE_PROVIDER_SITE_OTHER): Payer: BC Managed Care – PPO | Admitting: Internal Medicine

## 2013-02-15 ENCOUNTER — Encounter: Payer: Self-pay | Admitting: Internal Medicine

## 2013-02-15 VITALS — BP 112/70 | HR 84 | Temp 98.3°F | Wt 153.0 lb

## 2013-02-15 DIAGNOSIS — R51 Headache: Secondary | ICD-10-CM

## 2013-02-15 DIAGNOSIS — J029 Acute pharyngitis, unspecified: Secondary | ICD-10-CM

## 2013-02-15 LAB — POCT RAPID STREP A (OFFICE): Rapid Strep A Screen: NEGATIVE

## 2013-02-15 MED ORDER — TRAMADOL HCL 50 MG PO TABS: 50.0000 mg | ORAL_TABLET | Freq: Four times a day (QID) | ORAL | Status: DC | PRN

## 2013-02-15 MED ORDER — DOXYCYCLINE HYCLATE 100 MG PO TABS: ORAL_TABLET | ORAL | Status: DC

## 2013-02-15 NOTE — Patient Instructions (Addendum)
Zicam Melts or Zinc lozenges as per package label for scratchy throat .  Report persistent or progressive fever; discolored nasal or chest secretions; or frontal headache or facial  pain.   If you activate the  My Chart system; lab & Xray results will be released directly  to you as soon as I review & address these through the computer. If you choose not to sign up for My Chart within 36 hours of labs being drawn; results will be reviewed & interpretation added before being copied & mailed, causing a delay in getting the results to you.If you do not receive that report within 7-10 days ,please call. Additionally you can use this system to gain direct  access to your records  if  out of town or @ an office of a  physician who is not in  the My Chart network.  This improves continuity of care & places you in control of your medical record.

## 2013-02-15 NOTE — Progress Notes (Signed)
  Subjective:    Patient ID: Desiree Byrd, female    DOB: 21-Feb-1951, 62 y.o.   MRN: [id number]  HPI  Symptoms began 02/12/13 as a metallic taste in her mouth associated with sore throat which has  persisted. She also had significant headache over the crown; this responded to Claritin D. over 3 days.  This morning she awoke with a headache in the occipital and neck area followed by nausea and vomiting. She's also had some chills. There has been no associated fever or sweats.    Review of Systems She denies frontal headache, facial pain, dental pain, nasal purulence, otic pain, or otic discharge. She's had no associated cough or sputum production.  She's had no significant extrinsic symptoms.  She did take the flu shot. She has not had associated significant arthralgias and myalgias.      Objective:   Physical Exam General appearance :well nourished; in no acute distress or increased work of breathing but appears fatigued.  No  lymphadenopathy about the head, neck, or axilla noted.   Eyes: No conjunctival inflammation or lid edema is present. EOMI; normal vision  Ears:  External ear exam shows no significant lesions or deformities.  Otoscopic examination reveals clear canals, tympanic membranes are intact bilaterally without bulging, retraction, inflammation or discharge.  Nose:  External nasal examination shows no deformity or inflammation. Nasal mucosa are pink and moist without lesions or exudates. No septal dislocation or deviation.No obstruction to airflow.   Oral exam: Dental hygiene is good; lips and gums are healthy appearing.There is  oropharyngeal erythema ;no  exudate noted. There are 3 small erythematous vesicles over the soft palate.  Neck:  No deformities, masses, or tenderness noted.   Supple with full range of motion without pain. No meningeal signs of nucchal rigidity. Thyroid small & asymmetric , non tender  Heart:  Normal rate and regular rhythm. S1 and S2 normal without  gallop, murmur, click, rub .S4  Lungs:Chest clear to auscultation; no wheezes, rhonchi,rales ,or rubs present.No increased work of breathing.    Extremities:  No cyanosis, edema, or clubbing  noted . No meningeal signs with knee flexion range of motion.   Skin: Warm & dry         Assessment & Plan:  #1 pharyngitis with several erythematous vesicles; rule out mycoplasma. Beta strep is negative  #2 headache without meningeal signs  Plan: See orders and recommendations

## 2013-06-05 ENCOUNTER — Other Ambulatory Visit: Payer: Self-pay | Admitting: *Deleted

## 2013-06-05 MED ORDER — LINACLOTIDE 145 MCG PO CAPS: ORAL_CAPSULE | ORAL | Status: DC

## 2013-10-06 ENCOUNTER — Other Ambulatory Visit: Payer: Self-pay | Admitting: Internal Medicine

## 2014-02-08 ENCOUNTER — Encounter: Payer: BC Managed Care – PPO | Admitting: Sports Medicine

## 2014-02-21 ENCOUNTER — Encounter: Payer: BC Managed Care – PPO | Admitting: Sports Medicine

## 2014-02-22 ENCOUNTER — Other Ambulatory Visit: Payer: Self-pay | Admitting: Dermatology

## 2014-03-07 ENCOUNTER — Encounter: Payer: Self-pay | Admitting: Sports Medicine

## 2014-03-07 ENCOUNTER — Ambulatory Visit (INDEPENDENT_AMBULATORY_CARE_PROVIDER_SITE_OTHER): Payer: PRIVATE HEALTH INSURANCE | Admitting: Sports Medicine

## 2014-03-07 VITALS — BP 105/68 | HR 70 | Ht 67.5 in | Wt 153.0 lb

## 2014-03-07 DIAGNOSIS — M2021 Hallux rigidus, right foot: Secondary | ICD-10-CM

## 2014-03-07 DIAGNOSIS — M7741 Metatarsalgia, right foot: Secondary | ICD-10-CM

## 2014-03-07 DIAGNOSIS — R269 Unspecified abnormalities of gait and mobility: Secondary | ICD-10-CM

## 2014-03-07 DIAGNOSIS — M2020 Hallux rigidus, unspecified foot: Secondary | ICD-10-CM

## 2014-03-07 DIAGNOSIS — M7750 Other enthesopathy of unspecified foot: Secondary | ICD-10-CM

## 2014-03-07 DIAGNOSIS — M7742 Metatarsalgia, left foot: Secondary | ICD-10-CM

## 2014-03-07 NOTE — Progress Notes (Signed)
Patient ID: Desiree Byrd, female   DOB: Dec 20, 1950, 63 y.o.   MRN: [id number]  The patient has a history of bilateral foot pain. 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

## 2014-03-07 NOTE — Assessment & Plan Note (Signed)
significantly pronated gait  Patient was fitted for a : standard, cushioned, semi-rigid orthotic. The orthotic was heated and afterward the patient stood on the orthotic blank positioned on the orthotic stand. The patient was positioned in subtalar neutral position and 10 degrees of ankle dorsiflexion in a weight bearing stance. After completion of molding, a stable base was applied to the orthotic blank. The blank was ground to a stable position for weight bearing. Size: 9 red EVA Base: blue EVA Posting:  First ray on RT Additional orthotic padding:  MT pads  Preparation and evaluation time face to face of 50 mins

## 2014-03-07 NOTE — Assessment & Plan Note (Signed)
First ray post completed with brick EVA

## 2014-03-07 NOTE — Assessment & Plan Note (Signed)
Added metatarsal pads to both orthotics

## 2014-04-16 ENCOUNTER — Other Ambulatory Visit: Payer: Self-pay | Admitting: Dermatology

## 2014-04-20 ENCOUNTER — Encounter: Payer: Self-pay | Admitting: Internal Medicine

## 2014-04-20 DIAGNOSIS — C44319 Basal cell carcinoma of skin of other parts of face: Secondary | ICD-10-CM | POA: Insufficient documentation

## 2014-07-18 ENCOUNTER — Other Ambulatory Visit: Payer: Self-pay | Admitting: Gynecology

## 2014-07-19 LAB — CYTOLOGY - PAP

## 2014-08-10 ENCOUNTER — Encounter: Payer: Self-pay | Admitting: Internal Medicine

## 2014-08-10 ENCOUNTER — Other Ambulatory Visit (INDEPENDENT_AMBULATORY_CARE_PROVIDER_SITE_OTHER): Payer: PRIVATE HEALTH INSURANCE

## 2014-08-10 ENCOUNTER — Ambulatory Visit (INDEPENDENT_AMBULATORY_CARE_PROVIDER_SITE_OTHER): Payer: PRIVATE HEALTH INSURANCE | Admitting: Internal Medicine

## 2014-08-10 VITALS — BP 110/70 | HR 72 | Temp 98.2°F | Resp 12 | Wt 153.4 lb

## 2014-08-10 DIAGNOSIS — R0989 Other specified symptoms and signs involving the circulatory and respiratory systems: Secondary | ICD-10-CM

## 2014-08-10 DIAGNOSIS — R7309 Other abnormal glucose: Secondary | ICD-10-CM

## 2014-08-10 DIAGNOSIS — E7889 Other lipoprotein metabolism disorders: Secondary | ICD-10-CM | POA: Insufficient documentation

## 2014-08-10 DIAGNOSIS — Z23 Encounter for immunization: Secondary | ICD-10-CM

## 2014-08-10 LAB — HEMOGLOBIN A1C: HEMOGLOBIN A1C: 5.7 % (ref 4.6–6.5)

## 2014-08-10 NOTE — Progress Notes (Signed)
Pre visit review using our clinic review tool, if applicable. No additional management support is needed unless otherwise documented below in the visit note. 

## 2014-08-10 NOTE — Assessment & Plan Note (Signed)
A1c

## 2014-08-10 NOTE — Assessment & Plan Note (Signed)
No change needed

## 2014-08-10 NOTE — Progress Notes (Signed)
   Subjective:    Patient ID: Desiree Byrd, female    DOB: [birth year], 63 y.o.   MRN: [id number]  HPI   Her gynecologist performed labs; she brought the results. HDL is protective at 92 and LDL is 82. There is no family history of myocardial infarction or stroke. There is family history of hypertension  She does not know her glucose. Her fasting glucose was mildly elevated in 2012 with a value of 117. There is a history of gestational prediabetes in 1989. No family history of diabetes.  She is on a heart healthy diet and exercises 60 minutes a day without symptoms.    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 She has marked crepitus of the left knee without associated effusion.  Appears healthy and well-nourished & in no acute distress No carotid bruits are present.No neck pain distention present at 10 - 15 degrees.  Heart rhythm and rate are normal with no gallop or murmur. Accentuated S1 Chest is clear with no increased work of breathing Aorta is palpable but there is no evidence of aortic aneurysm or renal artery bruits Abdomen soft with no organomegaly or masses. No HJR No clubbing, cyanosis or edema present. Pedal pulses are intact  No ischemic skin changes are present . Fingernails healthy  Alert and oriented. Strength, tone normal         Assessment & Plan:  See Current Assessment & Plan in Problem List under specific Diagnosis

## 2014-08-10 NOTE — Patient Instructions (Signed)
Your next office appointment will be determined based upon review of your pending labs. Those instructions will be transmitted to you through My Chart . 

## 2014-08-14 ENCOUNTER — Encounter: Payer: Self-pay | Admitting: Internal Medicine

## 2014-08-15 ENCOUNTER — Telehealth: Payer: Self-pay

## 2014-08-15 NOTE — Telephone Encounter (Signed)
Message copied by Shelly Coss on Wed Aug 15, 2014  8:00 AM ------      Message from: Hendricks Limes      Created: Tue Aug 14, 2014  6:49 PM       Please notify patient      ----- Message -----         From: Lafayette Dragon, MD         Sent: 08/14/2014   5:43 PM           To: Hendricks Limes, MD, Hulan Saas, RN            Hop, I have reviewed Paytan Recine last colonoscopy  From 05/13/2005. Normal exam. Recall 10 years- will receive a recall letter in May 2016. Please let her know.      ----- Message -----         From: Hulan Saas, RN         Sent: 08/14/2014   3:23 PM           To: Lafayette Dragon, MD            Dr. Olevia Perches,      I printed a copy and put it in your in box.      R      ----- Message -----         From: Lafayette Dragon, MD         Sent: 08/13/2014  10:56 PM           To: Hulan Saas, RN            I still cannot see where it is. Not under Procedures or Notes or Media.      ----- Message -----         From: Hulan Saas, RN         Sent: 08/10/2014   3:34 PM           To: Lafayette Dragon, MD            Dr. Olevia Perches,      The report is now scanned into EPIC.      Rollene Fare      ----- Message -----         From: Lafayette Dragon, MD         Sent: 08/10/2014  12:37 PM           To: Hendricks Limes, MD, Hulan Saas, RN            Rollene Fare, could you, please, request this pt chart for my review of prior colonoscopy  From 2006. Thanx       ----- Message -----         From: Hendricks Limes, MD         Sent: 08/10/2014   9:13 AM           To: Lafayette Dragon, MD            Please verify when follow up colonoscopy is due based on your records. Thanks, Hopp                                     ------

## 2014-08-15 NOTE — Telephone Encounter (Signed)
Patient has been notified

## 2014-08-16 ENCOUNTER — Ambulatory Visit: Payer: PRIVATE HEALTH INSURANCE | Admitting: Sports Medicine

## 2014-10-04 ENCOUNTER — Encounter: Payer: Self-pay | Admitting: Internal Medicine

## 2014-10-04 ENCOUNTER — Ambulatory Visit (INDEPENDENT_AMBULATORY_CARE_PROVIDER_SITE_OTHER): Payer: PRIVATE HEALTH INSURANCE | Admitting: Internal Medicine

## 2014-10-04 VITALS — BP 96/64 | HR 72 | Temp 98.4°F | Resp 14 | Wt 150.0 lb

## 2014-10-04 DIAGNOSIS — J209 Acute bronchitis, unspecified: Secondary | ICD-10-CM

## 2014-10-04 MED ORDER — PREDNISONE 20 MG PO TABS: 20.0000 mg | ORAL_TABLET | Freq: Two times a day (BID) | ORAL | Status: DC

## 2014-10-04 MED ORDER — HYDROCODONE-HOMATROPINE 5-1.5 MG/5ML PO SYRP: 5.0000 mL | ORAL_SOLUTION | Freq: Four times a day (QID) | ORAL | Status: DC | PRN

## 2014-10-04 MED ORDER — AZITHROMYCIN 250 MG PO TABS: ORAL_TABLET | ORAL | Status: DC

## 2014-10-04 NOTE — Progress Notes (Signed)
   Subjective:    Patient ID: Desiree Byrd, female    DOB: 02-01-1951, 63 y.o.   MRN: [id number]  HPI   Her symptoms began 09/29/14 and have progressed. She has paroxysms of nonproductive cough intermittently throughout the day lasting 1-2 minutes. Although this is  nonproductive she does have somewhat of a rattle. She has significant postnasal drainage.  The cough is worse with deep breathing or certain poses in Pilates. Talking will also aggravate it.  Rest does help control cough. Mucinex was of some benefit.  She has some associated shortness of breath.  She's never smoked but has a past history of spontaneous pneumothorax 2      Review of Systems   With the cough she has generalized headache described as a level III on a 10 scale   Frontal headache, facial pain , nasal purulence, dental pain, sore throat , otic pain or otic discharge denied. No fever , chills or sweats.     Objective:   Physical Exam   Positive or pertinent findings include: She has severe paroxysms of cough which is brassy & nonproductive. Complete lung exam including whispered pectoriloquy & tactile fremitus revealed no abnormalities.  General appearance :adequately nourished; in no distress Eyes: No conjunctival inflammation or scleral icterus is present. Oral exam: Dental hygiene is good. Lips and gums are healthy appearing.There is no oropharyngeal erythema or exudate noted.  Heart:  Normal rate and regular rhythm. S1 and S2 normal without gallop, murmur, click, rub or other extra sounds  Lungs:Chest clear to auscultation; no wheezes, rhonchi,rales ,or rubs present.No increased work of breathing.  Skin:Warm & dry.  Intact without suspicious lesions or rashes ; no jaundice or tenting Lymphatic: No lymphadenopathy is noted about the head, neck, axilla            Assessment & Plan:  #1 acute bronchitis w/o bronchospasm #2 non allergic rhinitis #3 PMH spontaneous PTX X 2 Plan: See orders and  recommendations

## 2014-10-04 NOTE — Patient Instructions (Signed)
Plain Mucinex (NOT D) for thick secretions ;force NON dairy fluids .   Nasal cleansing in the shower as discussed with lather of mild shampoo.After 10 seconds wash off lather while  exhaling through nostrils. Make sure that all residual soap is removed to prevent irritation.  Flonase OR Nasacort AQ 1 spray in each nostril twice a day as needed. Use the "crossover" technique into opposite nostril spraying toward opposite ear @ 45 degree angle, not straight up into nostril.  Use a Neti pot daily only  as needed for significant sinus congestion; going from open side to congested side . Plain Allegra (NOT D )  160 daily , Loratidine 10 mg , OR Zyrtec 10 mg @ bedtime  as needed for itchy eyes & sneezing.  Dulera two inhalations every 12 hours; gargle and spit after use

## 2014-10-04 NOTE — Progress Notes (Signed)
Pre visit review using our clinic review tool, if applicable. No additional management support is needed unless otherwise documented below in the visit note. 

## 2014-12-17 HISTORY — PX: SHOULDER SURGERY: SHX246

## 2015-03-14 ENCOUNTER — Encounter: Payer: Self-pay | Admitting: Internal Medicine

## 2015-03-25 ENCOUNTER — Encounter: Payer: Self-pay | Admitting: Internal Medicine

## 2015-05-13 ENCOUNTER — Ambulatory Visit (AMBULATORY_SURGERY_CENTER): Payer: Self-pay | Admitting: *Deleted

## 2015-05-13 VITALS — Ht 68.0 in | Wt 151.0 lb

## 2015-05-13 DIAGNOSIS — Z1211 Encounter for screening for malignant neoplasm of colon: Secondary | ICD-10-CM

## 2015-05-13 MED ORDER — NA SULFATE-K SULFATE-MG SULF 17.5-3.13-1.6 GM/177ML PO SOLN: ORAL | Status: DC

## 2015-05-13 NOTE — Progress Notes (Signed)
Patient denies any allergies to eggs or soy. Patient denies any problems with anesthesia/sedation. Patient denies any oxygen use at home and does not take any diet/weight loss medications. EMMI education assisgned to patient on colonoscopy, this was explained and instructions given to patient. Patient denies any problems with constipation. Patient asking if husband can leave and be called, "like last time", Explained to patient in detail the "care-partner policy". Patient verbalizes understanding.

## 2015-05-21 ENCOUNTER — Encounter: Payer: Self-pay | Admitting: Internal Medicine

## 2015-05-21 ENCOUNTER — Ambulatory Visit (AMBULATORY_SURGERY_CENTER): Payer: Medicare Other | Admitting: Internal Medicine

## 2015-05-21 VITALS — BP 120/68 | HR 73 | Temp 98.1°F | Resp 21 | Ht 68.0 in | Wt 150.0 lb

## 2015-05-21 DIAGNOSIS — Z1211 Encounter for screening for malignant neoplasm of colon: Secondary | ICD-10-CM

## 2015-05-21 DIAGNOSIS — K635 Polyp of colon: Secondary | ICD-10-CM | POA: Diagnosis not present

## 2015-05-21 DIAGNOSIS — D122 Benign neoplasm of ascending colon: Secondary | ICD-10-CM | POA: Diagnosis not present

## 2015-05-21 MED ORDER — SODIUM CHLORIDE 0.9 % IV SOLN: 500.0000 mL | INTRAVENOUS | Status: DC

## 2015-05-21 NOTE — Op Note (Signed)
[facility]  [address] [place], [id number]   COLONOSCOPY PROCEDURE REPORT  PATIENT: Desiree, Byrd  MR#: [id number] BIRTHDATE: Jan 27, 1951 , 53  yrs. old GENDER: female ENDOSCOPIST: Lafayette Dragon, MD REFERRED HY:IFOYDXA Linna Darner, M.D. PROCEDURE DATE:  05/21/2015 PROCEDURE:   Colonoscopy, screening and Colonoscopy with cold biopsy polypectomy First Screening Colonoscopy - Avg.  risk and is 50 yrs.  old or older - No.  Prior Negative Screening - Now for repeat screening. 10 or more years since last screening  History of Adenoma - Now for follow-up colonoscopy & has been > or = to 3 yrs.  N/A  Polyps removed today? 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 MEDICATIONS: Monitored anesthesia care and Propofol 450 mg IV  DESCRIPTION OF PROCEDURE:   After the risks benefits and alternatives of the procedure were thoroughly explained, informed consent was obtained.  The digital rectal exam revealed no abnormalities of the rectum.   The LB [id number]-[id number]  endoscope was introduced through the anus and advanced to the cecum, which was identified by the ileocecal valve. No adverse events experienced.   The quality of the prep was good.  (MoviPrep was used)  The instrument was then slowly withdrawn as the colon was fully examined. Estimated blood loss is zero unless otherwise noted in this procedure report.      COLON FINDINGS: A firm sessile polyp measuring 3 mm in size was found in the ascending colon.  A polypectomy was performed with cold forceps. there were a few scattered diverticuli through the colon  The resection was complete, the polyp tissue was completely retrieved and sent to histology.  Retroflexed views revealed no abnormalities. The time to cecum = 29.35 Withdrawal time = 6.10 The scope was  withdrawn and the procedure completed. COMPLICATIONS: There were no immediate complications.  ENDOSCOPIC IMPRESSION: 1.Sessile polyp was found in the ascending colon; polypectomy was performed with cold forceps 2.minimal diverticulosis of the right colon  RECOMMENDATIONS: 1.  Await pathology results 2.  High fiber diet Recall colonoscopy pending path report  eSigned:  Lafayette Dragon, MD 05/21/2015 8:49 AM   cc:   PATIENT NAME:  Desiree, Byrd MR#: [id number]

## 2015-05-21 NOTE — Patient Instructions (Signed)
YOU HAD AN ENDOSCOPIC PROCEDURE TODAY AT [facility]:   Refer to the procedure report that was given to you for any specific questions about what was found during the examination.  If the procedure report does not answer your questions, please call your gastroenterologist to clarify.  If you requested that your care partner not be given the details of your procedure findings, then the procedure report has been included in a sealed envelope for you to review at your convenience later.  YOU SHOULD EXPECT: Some feelings of bloating in the abdomen. Passage of more gas than usual.  Walking can help get rid of the air that was put into your GI tract during the procedure and reduce the bloating. If you had a lower endoscopy (such as a colonoscopy or flexible sigmoidoscopy) you may notice spotting of blood in your stool or on the toilet paper. If you underwent a bowel prep for your procedure, you may not have a normal bowel movement for a few days.  Please Note:  You might notice some irritation and congestion in your nose or some drainage.  This is from the oxygen used during your procedure.  There is no need for concern and it should clear up in a day or so.  SYMPTOMS TO REPORT IMMEDIATELY:   Following lower endoscopy (colonoscopy or flexible sigmoidoscopy):  Excessive amounts of blood in the stool  Significant tenderness or worsening of abdominal pains  Swelling of the abdomen that is new, acute  Fever of 100F or higher  For urgent or emergent issues, a gastroenterologist can be reached at any hour by calling [phone].   DIET: Your first meal following the procedure should be a small meal and then it is ok to progress to your normal diet. Heavy or fried foods are harder to digest and may make you feel nauseous or bloated.  Likewise, meals heavy in dairy and vegetables can increase bloating.  Drink plenty of fluids but you should avoid alcoholic beverages for 24  hours.  ACTIVITY:  You should plan to take it easy for the rest of today and you should NOT DRIVE or use heavy machinery until tomorrow (because of the sedation medicines used during the test).    FOLLOW UP: Our staff will call the number listed on your records the next business day following your procedure to check on you and address any questions or concerns that you may have regarding the information given to you following your procedure. If we do not reach you, we will leave a message.  However, if you are feeling well and you are not experiencing any problems, there is no need to return our call.  We will assume that you have returned to your regular daily activities without incident.  If any biopsies were taken you will be contacted by phone or by letter within the next 1-3 weeks.  Please call us at [phone] if you have not heard about the biopsies in 3 weeks.    SIGNATURES/CONFIDENTIALITY: You and/or your care partner have signed paperwork which will be entered into your electronic medical record.  These signatures attest to the fact that that the information above on your After Visit Summary has been reviewed and is understood.  Full responsibility of the confidentiality of this discharge information lies with you and/or your care-partner.  Polyp, diverticulosis, and high fiber diet information given.

## 2015-05-21 NOTE — Progress Notes (Signed)
Called to room to assist during endoscopic procedure.  Patient ID and intended procedure confirmed with present staff. Received instructions for my participation in the procedure from the performing physician.  

## 2015-05-21 NOTE — Progress Notes (Signed)
A/ox3 pleased with MAC, report to Jane RN 

## 2015-05-22 ENCOUNTER — Telehealth: Payer: Self-pay | Admitting: *Deleted

## 2015-05-22 NOTE — Telephone Encounter (Signed)
  Follow up Call-  Call back number 05/21/2015  Post procedure Call Back phone  # [phone]  Permission to leave phone message Yes     Patient questions:  Do you have a fever, pain , or abdominal swelling? No. Pain Score  0 *  Have you tolerated food without any problems? Yes.    Have you been able to return to your normal activities? Yes.    Do you have any questions about your discharge instructions: Diet   No. Medications  No. Follow up visit  No.  Do you have questions or concerns about your Care? No.  Actions: * If pain score is 4 or above: No action needed, pain <4.

## 2015-05-23 DIAGNOSIS — S46011D Strain of muscle(s) and tendon(s) of the rotator cuff of right shoulder, subsequent encounter: Secondary | ICD-10-CM | POA: Diagnosis not present

## 2015-05-23 DIAGNOSIS — M25511 Pain in right shoulder: Secondary | ICD-10-CM | POA: Diagnosis not present

## 2015-05-23 DIAGNOSIS — M6281 Muscle weakness (generalized): Secondary | ICD-10-CM | POA: Diagnosis not present

## 2015-05-27 DIAGNOSIS — S46011D Strain of muscle(s) and tendon(s) of the rotator cuff of right shoulder, subsequent encounter: Secondary | ICD-10-CM | POA: Diagnosis not present

## 2015-05-27 DIAGNOSIS — M6281 Muscle weakness (generalized): Secondary | ICD-10-CM | POA: Diagnosis not present

## 2015-05-27 DIAGNOSIS — M25511 Pain in right shoulder: Secondary | ICD-10-CM | POA: Diagnosis not present

## 2015-05-28 ENCOUNTER — Encounter: Payer: Self-pay | Admitting: Internal Medicine

## 2015-05-28 DIAGNOSIS — M25511 Pain in right shoulder: Secondary | ICD-10-CM | POA: Diagnosis not present

## 2015-05-30 DIAGNOSIS — M25511 Pain in right shoulder: Secondary | ICD-10-CM | POA: Diagnosis not present

## 2015-05-30 DIAGNOSIS — M6281 Muscle weakness (generalized): Secondary | ICD-10-CM | POA: Diagnosis not present

## 2015-05-30 DIAGNOSIS — S46011D Strain of muscle(s) and tendon(s) of the rotator cuff of right shoulder, subsequent encounter: Secondary | ICD-10-CM | POA: Diagnosis not present

## 2015-06-10 DIAGNOSIS — M6281 Muscle weakness (generalized): Secondary | ICD-10-CM | POA: Diagnosis not present

## 2015-06-10 DIAGNOSIS — S46011D Strain of muscle(s) and tendon(s) of the rotator cuff of right shoulder, subsequent encounter: Secondary | ICD-10-CM | POA: Diagnosis not present

## 2015-06-10 DIAGNOSIS — M25511 Pain in right shoulder: Secondary | ICD-10-CM | POA: Diagnosis not present

## 2015-06-12 DIAGNOSIS — S46011D Strain of muscle(s) and tendon(s) of the rotator cuff of right shoulder, subsequent encounter: Secondary | ICD-10-CM | POA: Diagnosis not present

## 2015-06-12 DIAGNOSIS — M25511 Pain in right shoulder: Secondary | ICD-10-CM | POA: Diagnosis not present

## 2015-06-12 DIAGNOSIS — M6281 Muscle weakness (generalized): Secondary | ICD-10-CM | POA: Diagnosis not present

## 2015-06-13 DIAGNOSIS — M25511 Pain in right shoulder: Secondary | ICD-10-CM | POA: Diagnosis not present

## 2015-06-13 DIAGNOSIS — M6281 Muscle weakness (generalized): Secondary | ICD-10-CM | POA: Diagnosis not present

## 2015-06-13 DIAGNOSIS — S46011D Strain of muscle(s) and tendon(s) of the rotator cuff of right shoulder, subsequent encounter: Secondary | ICD-10-CM | POA: Diagnosis not present

## 2015-06-17 DIAGNOSIS — M25511 Pain in right shoulder: Secondary | ICD-10-CM | POA: Diagnosis not present

## 2015-06-17 DIAGNOSIS — S46011D Strain of muscle(s) and tendon(s) of the rotator cuff of right shoulder, subsequent encounter: Secondary | ICD-10-CM | POA: Diagnosis not present

## 2015-06-17 DIAGNOSIS — M6281 Muscle weakness (generalized): Secondary | ICD-10-CM | POA: Diagnosis not present

## 2015-06-19 DIAGNOSIS — S46011D Strain of muscle(s) and tendon(s) of the rotator cuff of right shoulder, subsequent encounter: Secondary | ICD-10-CM | POA: Diagnosis not present

## 2015-06-19 DIAGNOSIS — M25511 Pain in right shoulder: Secondary | ICD-10-CM | POA: Diagnosis not present

## 2015-06-19 DIAGNOSIS — M6281 Muscle weakness (generalized): Secondary | ICD-10-CM | POA: Diagnosis not present

## 2015-06-20 DIAGNOSIS — M6281 Muscle weakness (generalized): Secondary | ICD-10-CM | POA: Diagnosis not present

## 2015-06-20 DIAGNOSIS — S46011D Strain of muscle(s) and tendon(s) of the rotator cuff of right shoulder, subsequent encounter: Secondary | ICD-10-CM | POA: Diagnosis not present

## 2015-06-20 DIAGNOSIS — M25511 Pain in right shoulder: Secondary | ICD-10-CM | POA: Diagnosis not present

## 2015-06-24 DIAGNOSIS — S46011D Strain of muscle(s) and tendon(s) of the rotator cuff of right shoulder, subsequent encounter: Secondary | ICD-10-CM | POA: Diagnosis not present

## 2015-06-24 DIAGNOSIS — M25511 Pain in right shoulder: Secondary | ICD-10-CM | POA: Diagnosis not present

## 2015-06-24 DIAGNOSIS — M6281 Muscle weakness (generalized): Secondary | ICD-10-CM | POA: Diagnosis not present

## 2015-06-26 DIAGNOSIS — M25511 Pain in right shoulder: Secondary | ICD-10-CM | POA: Diagnosis not present

## 2015-06-26 DIAGNOSIS — S46011D Strain of muscle(s) and tendon(s) of the rotator cuff of right shoulder, subsequent encounter: Secondary | ICD-10-CM | POA: Diagnosis not present

## 2015-06-26 DIAGNOSIS — M6281 Muscle weakness (generalized): Secondary | ICD-10-CM | POA: Diagnosis not present

## 2015-06-27 DIAGNOSIS — S46011D Strain of muscle(s) and tendon(s) of the rotator cuff of right shoulder, subsequent encounter: Secondary | ICD-10-CM | POA: Diagnosis not present

## 2015-06-27 DIAGNOSIS — H01004 Unspecified blepharitis left upper eyelid: Secondary | ICD-10-CM | POA: Diagnosis not present

## 2015-06-27 DIAGNOSIS — H524 Presbyopia: Secondary | ICD-10-CM | POA: Diagnosis not present

## 2015-06-27 DIAGNOSIS — M6281 Muscle weakness (generalized): Secondary | ICD-10-CM | POA: Diagnosis not present

## 2015-06-27 DIAGNOSIS — S46001D Unspecified injury of muscle(s) and tendon(s) of the rotator cuff of right shoulder, subsequent encounter: Secondary | ICD-10-CM | POA: Diagnosis not present

## 2015-06-27 DIAGNOSIS — H01001 Unspecified blepharitis right upper eyelid: Secondary | ICD-10-CM | POA: Diagnosis not present

## 2015-06-27 DIAGNOSIS — M25511 Pain in right shoulder: Secondary | ICD-10-CM | POA: Diagnosis not present

## 2015-06-27 DIAGNOSIS — H04123 Dry eye syndrome of bilateral lacrimal glands: Secondary | ICD-10-CM | POA: Diagnosis not present

## 2015-06-27 DIAGNOSIS — M25311 Other instability, right shoulder: Secondary | ICD-10-CM | POA: Diagnosis not present

## 2015-07-01 DIAGNOSIS — S46001D Unspecified injury of muscle(s) and tendon(s) of the rotator cuff of right shoulder, subsequent encounter: Secondary | ICD-10-CM | POA: Diagnosis not present

## 2015-07-01 DIAGNOSIS — M25511 Pain in right shoulder: Secondary | ICD-10-CM | POA: Diagnosis not present

## 2015-07-01 DIAGNOSIS — S46011D Strain of muscle(s) and tendon(s) of the rotator cuff of right shoulder, subsequent encounter: Secondary | ICD-10-CM | POA: Diagnosis not present

## 2015-07-01 DIAGNOSIS — M25311 Other instability, right shoulder: Secondary | ICD-10-CM | POA: Diagnosis not present

## 2015-07-01 DIAGNOSIS — M6281 Muscle weakness (generalized): Secondary | ICD-10-CM | POA: Diagnosis not present

## 2015-07-03 DIAGNOSIS — S46011D Strain of muscle(s) and tendon(s) of the rotator cuff of right shoulder, subsequent encounter: Secondary | ICD-10-CM | POA: Diagnosis not present

## 2015-07-03 DIAGNOSIS — M25511 Pain in right shoulder: Secondary | ICD-10-CM | POA: Diagnosis not present

## 2015-07-03 DIAGNOSIS — M6281 Muscle weakness (generalized): Secondary | ICD-10-CM | POA: Diagnosis not present

## 2015-07-04 DIAGNOSIS — M6281 Muscle weakness (generalized): Secondary | ICD-10-CM | POA: Diagnosis not present

## 2015-07-04 DIAGNOSIS — M25511 Pain in right shoulder: Secondary | ICD-10-CM | POA: Diagnosis not present

## 2015-07-04 DIAGNOSIS — S46011D Strain of muscle(s) and tendon(s) of the rotator cuff of right shoulder, subsequent encounter: Secondary | ICD-10-CM | POA: Diagnosis not present

## 2015-07-08 DIAGNOSIS — M6281 Muscle weakness (generalized): Secondary | ICD-10-CM | POA: Diagnosis not present

## 2015-07-08 DIAGNOSIS — S46011D Strain of muscle(s) and tendon(s) of the rotator cuff of right shoulder, subsequent encounter: Secondary | ICD-10-CM | POA: Diagnosis not present

## 2015-07-08 DIAGNOSIS — M25511 Pain in right shoulder: Secondary | ICD-10-CM | POA: Diagnosis not present

## 2015-07-10 DIAGNOSIS — M25511 Pain in right shoulder: Secondary | ICD-10-CM | POA: Diagnosis not present

## 2015-07-10 DIAGNOSIS — M6281 Muscle weakness (generalized): Secondary | ICD-10-CM | POA: Diagnosis not present

## 2015-07-10 DIAGNOSIS — S46011D Strain of muscle(s) and tendon(s) of the rotator cuff of right shoulder, subsequent encounter: Secondary | ICD-10-CM | POA: Diagnosis not present

## 2015-07-11 DIAGNOSIS — S46011D Strain of muscle(s) and tendon(s) of the rotator cuff of right shoulder, subsequent encounter: Secondary | ICD-10-CM | POA: Diagnosis not present

## 2015-07-11 DIAGNOSIS — M25511 Pain in right shoulder: Secondary | ICD-10-CM | POA: Diagnosis not present

## 2015-07-11 DIAGNOSIS — M6281 Muscle weakness (generalized): Secondary | ICD-10-CM | POA: Diagnosis not present

## 2015-07-15 DIAGNOSIS — S46011D Strain of muscle(s) and tendon(s) of the rotator cuff of right shoulder, subsequent encounter: Secondary | ICD-10-CM | POA: Diagnosis not present

## 2015-07-15 DIAGNOSIS — M6281 Muscle weakness (generalized): Secondary | ICD-10-CM | POA: Diagnosis not present

## 2015-07-15 DIAGNOSIS — M25511 Pain in right shoulder: Secondary | ICD-10-CM | POA: Diagnosis not present

## 2015-07-19 DIAGNOSIS — L820 Inflamed seborrheic keratosis: Secondary | ICD-10-CM | POA: Diagnosis not present

## 2015-07-19 DIAGNOSIS — Z85828 Personal history of other malignant neoplasm of skin: Secondary | ICD-10-CM | POA: Diagnosis not present

## 2015-07-23 DIAGNOSIS — M6281 Muscle weakness (generalized): Secondary | ICD-10-CM | POA: Diagnosis not present

## 2015-07-23 DIAGNOSIS — S46011D Strain of muscle(s) and tendon(s) of the rotator cuff of right shoulder, subsequent encounter: Secondary | ICD-10-CM | POA: Diagnosis not present

## 2015-07-23 DIAGNOSIS — M79674 Pain in right toe(s): Secondary | ICD-10-CM | POA: Diagnosis not present

## 2015-07-23 DIAGNOSIS — M25511 Pain in right shoulder: Secondary | ICD-10-CM | POA: Diagnosis not present

## 2015-08-05 ENCOUNTER — Other Ambulatory Visit: Payer: Self-pay | Admitting: Gynecology

## 2015-08-05 DIAGNOSIS — Z124 Encounter for screening for malignant neoplasm of cervix: Secondary | ICD-10-CM | POA: Diagnosis not present

## 2015-08-05 DIAGNOSIS — Z6823 Body mass index (BMI) 23.0-23.9, adult: Secondary | ICD-10-CM | POA: Diagnosis not present

## 2015-08-05 DIAGNOSIS — Z1231 Encounter for screening mammogram for malignant neoplasm of breast: Secondary | ICD-10-CM | POA: Diagnosis not present

## 2015-08-06 LAB — CYTOLOGY - PAP

## 2015-12-09 DIAGNOSIS — H60391 Other infective otitis externa, right ear: Secondary | ICD-10-CM | POA: Diagnosis not present

## 2016-07-02 DIAGNOSIS — K1370 Unspecified lesions of oral mucosa: Secondary | ICD-10-CM | POA: Diagnosis not present

## 2016-08-03 DIAGNOSIS — Z1231 Encounter for screening mammogram for malignant neoplasm of breast: Secondary | ICD-10-CM | POA: Diagnosis not present

## 2016-08-05 DIAGNOSIS — B029 Zoster without complications: Secondary | ICD-10-CM | POA: Diagnosis not present

## 2016-08-05 DIAGNOSIS — Z85828 Personal history of other malignant neoplasm of skin: Secondary | ICD-10-CM | POA: Diagnosis not present

## 2016-09-29 ENCOUNTER — Encounter: Payer: Self-pay | Admitting: Sports Medicine

## 2016-09-29 ENCOUNTER — Ambulatory Visit (INDEPENDENT_AMBULATORY_CARE_PROVIDER_SITE_OTHER): Payer: Medicare Other | Admitting: Sports Medicine

## 2016-09-29 DIAGNOSIS — M25579 Pain in unspecified ankle and joints of unspecified foot: Secondary | ICD-10-CM | POA: Diagnosis not present

## 2016-09-29 DIAGNOSIS — M7742 Metatarsalgia, left foot: Secondary | ICD-10-CM

## 2016-09-29 DIAGNOSIS — M7741 Metatarsalgia, right foot: Secondary | ICD-10-CM | POA: Diagnosis not present

## 2016-09-29 DIAGNOSIS — S76312A Strain of muscle, fascia and tendon of the posterior muscle group at thigh level, left thigh, initial encounter: Secondary | ICD-10-CM | POA: Diagnosis not present

## 2016-09-29 NOTE — Assessment & Plan Note (Signed)
Pads replaced on orthotics Cont these

## 2016-09-29 NOTE — Assessment & Plan Note (Signed)
We will add lateral posting on left Increase arch support with scaphoid pads  Cont ray 1 post RT  These corrections felt better  We will test and see if she needs new orthotics or if this will work

## 2016-09-29 NOTE — Progress Notes (Signed)
CC: left foot pain  Patient is active tennis athlete Placed in orthotics 2 years ago and these helped her halux rigidus on RT and metatarsalgia Now some new pain along left little toe No new injury  She does have a problem with left thigh pain TX at 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

## 2016-09-29 NOTE — Assessment & Plan Note (Signed)
Start diver and extender exercises  RTC as we should scan hamstring  Modify PT plan

## 2016-10-19 DIAGNOSIS — Z6823 Body mass index (BMI) 23.0-23.9, adult: Secondary | ICD-10-CM | POA: Diagnosis not present

## 2016-10-19 DIAGNOSIS — Z01419 Encounter for gynecological examination (general) (routine) without abnormal findings: Secondary | ICD-10-CM | POA: Diagnosis not present

## 2016-10-21 DIAGNOSIS — H0014 Chalazion left upper eyelid: Secondary | ICD-10-CM | POA: Diagnosis not present

## 2016-10-27 ENCOUNTER — Ambulatory Visit: Payer: Self-pay

## 2016-10-27 ENCOUNTER — Ambulatory Visit (INDEPENDENT_AMBULATORY_CARE_PROVIDER_SITE_OTHER): Payer: Medicare Other | Admitting: Sports Medicine

## 2016-10-27 VITALS — BP 107/66

## 2016-10-27 DIAGNOSIS — R269 Unspecified abnormalities of gait and mobility: Secondary | ICD-10-CM

## 2016-10-27 DIAGNOSIS — S76312A Strain of muscle, fascia and tendon of the posterior muscle group at thigh level, left thigh, initial encounter: Secondary | ICD-10-CM

## 2016-10-27 DIAGNOSIS — S76302S Unspecified injury of muscle, fascia and tendon of the posterior muscle group at thigh level, left thigh, sequela: Secondary | ICD-10-CM | POA: Diagnosis not present

## 2016-10-27 NOTE — Progress Notes (Signed)
Chief complaint; left hamstring pain  Within 2 weeks of starting the series of hamstring exercises she was able to play the state tennis tournament She played for matches in 4 days without any significant worsening She has been doing the home exercises everyday Now she has very little pain with walking or tennis She still gets pain with sitting just under her left buttocks  Adding some additional padding to her orthotics also gave her more support She tolerated an increase in arch support  Review of systems No sciatic No low back pain No cough or sneeze pain  Physical exam Thin white female in no acute distress BP 107/66   Now testing of left hamstring in 3 positions reveal strength essentially equal to the right H. Test is still slightly tighter on the right Excellent hip abduction strength Excellent hip flexion and adduction strength No real tenderness to palpation over the ischial tuberosity  Ultrasound of left hamstring The upper hamstring was viewed in longitudinal and transverse images Attachment of the biceps femoris at the ischial tuberosity shows a couple of calcifications and some hypoechoic change suggestive of edema There is a very small ischial bursa that is only about 1 cm in width There is a calcification of about a half centimeter located just above the ischial tuberosity Semimembranosus and semitendinosus tendons look intact Some minimal calcification noted  Impression: ultrasound consistent with chronic hamstring tendinopathy  Ultrasound and interpretation by Wolfgang Phoenix. Oneida Alar, MD

## 2016-10-27 NOTE — Assessment & Plan Note (Signed)
This is much improved  I progressed her exercise program today  Okay to play tennis and do normal activities  Recheck if not completely resolving

## 2016-10-27 NOTE — Assessment & Plan Note (Signed)
We need to make new orthotics for her in the coming year particularly with her changes in her arch

## 2016-11-02 DIAGNOSIS — H0014 Chalazion left upper eyelid: Secondary | ICD-10-CM | POA: Diagnosis not present

## 2016-12-16 ENCOUNTER — Ambulatory Visit: Payer: Medicare Other | Admitting: Nurse Practitioner

## 2016-12-18 DIAGNOSIS — R221 Localized swelling, mass and lump, neck: Secondary | ICD-10-CM | POA: Diagnosis not present

## 2016-12-21 DIAGNOSIS — Z6823 Body mass index (BMI) 23.0-23.9, adult: Secondary | ICD-10-CM | POA: Diagnosis not present

## 2016-12-21 DIAGNOSIS — Z23 Encounter for immunization: Secondary | ICD-10-CM | POA: Diagnosis not present

## 2016-12-21 DIAGNOSIS — Z0000 Encounter for general adult medical examination without abnormal findings: Secondary | ICD-10-CM | POA: Diagnosis not present

## 2016-12-22 DIAGNOSIS — Z0000 Encounter for general adult medical examination without abnormal findings: Secondary | ICD-10-CM | POA: Diagnosis not present

## 2016-12-25 DIAGNOSIS — L308 Other specified dermatitis: Secondary | ICD-10-CM | POA: Diagnosis not present

## 2016-12-25 DIAGNOSIS — L821 Other seborrheic keratosis: Secondary | ICD-10-CM | POA: Diagnosis not present

## 2016-12-25 DIAGNOSIS — Z85828 Personal history of other malignant neoplasm of skin: Secondary | ICD-10-CM | POA: Diagnosis not present

## 2017-01-19 DIAGNOSIS — M25572 Pain in left ankle and joints of left foot: Secondary | ICD-10-CM | POA: Diagnosis not present

## 2017-01-19 DIAGNOSIS — M25551 Pain in right hip: Secondary | ICD-10-CM | POA: Diagnosis not present

## 2017-01-19 DIAGNOSIS — M545 Low back pain: Secondary | ICD-10-CM | POA: Diagnosis not present

## 2017-01-20 DIAGNOSIS — M7631 Iliotibial band syndrome, right leg: Secondary | ICD-10-CM | POA: Diagnosis not present

## 2017-01-20 DIAGNOSIS — M25572 Pain in left ankle and joints of left foot: Secondary | ICD-10-CM | POA: Diagnosis not present

## 2017-01-20 DIAGNOSIS — S43421A Sprain of right rotator cuff capsule, initial encounter: Secondary | ICD-10-CM | POA: Diagnosis not present

## 2017-02-02 ENCOUNTER — Encounter: Payer: Self-pay | Admitting: Sports Medicine

## 2017-02-02 ENCOUNTER — Ambulatory Visit (INDEPENDENT_AMBULATORY_CARE_PROVIDER_SITE_OTHER): Payer: Medicare Other | Admitting: Sports Medicine

## 2017-02-02 DIAGNOSIS — M25579 Pain in unspecified ankle and joints of unspecified foot: Secondary | ICD-10-CM | POA: Diagnosis not present

## 2017-02-02 DIAGNOSIS — R269 Unspecified abnormalities of gait and mobility: Secondary | ICD-10-CM | POA: Diagnosis not present

## 2017-02-03 NOTE — Assessment & Plan Note (Signed)
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

## 2017-02-03 NOTE — Assessment & Plan Note (Signed)
With work on her feet we need to try to get better foot support into work shoes Sports orthtoics will not work in these  Patient was fitted for a : standard, cushioned, semi-rigid orthotic. The orthotic was heated and afterward the patient stood on the orthotic blank positioned on the orthotic stand. The patient was positioned in subtalar neutral position and 10 degrees of ankle dorsiflexion in a weight bearing stance. After completion of molding, a stable base was applied to the orthotic blank. The blank was ground to a stable position for weight bearing. Size: 9 thin dress orthotic Base: blue foam Posting: medial to lateral heel wedge Additional orthotic padding: none  She felt better support and less pain Post gait is controlled better with less pronation  I spent 42 minutes with this patient. Over 50% of visit was spend in counseling and coordination of care for problems with ongoing foot and ankle pain

## 2017-02-03 NOTE — Progress Notes (Signed)
CC: left ankle pain  Has started playing more tennis Recently more swelling in anterior lateral left ankle No recent injury  Orthotics helping in tennis Getting foot pain in walking shoes Felt pads not enough to control this  ROS HS is now non painful Metatarsalgia is controlled in orthotics  PE Pleasant F in NAD BP 106/74   Ht 5\' 8"  (1.727 m)   Wt 153 lb (69.4 kg)   BMI 23.26 kg/m   Left ankle shoes normal motion Swelling in sinus tarsi laterally This is not present on RT  Standing has foot on anckl subluxatpn at subtalar joint Loss of long arch with pronation

## 2017-03-16 DIAGNOSIS — M25572 Pain in left ankle and joints of left foot: Secondary | ICD-10-CM | POA: Diagnosis not present

## 2017-03-18 DIAGNOSIS — M25572 Pain in left ankle and joints of left foot: Secondary | ICD-10-CM | POA: Diagnosis not present

## 2017-03-23 DIAGNOSIS — M25572 Pain in left ankle and joints of left foot: Secondary | ICD-10-CM | POA: Diagnosis not present

## 2017-03-25 DIAGNOSIS — L820 Inflamed seborrheic keratosis: Secondary | ICD-10-CM | POA: Diagnosis not present

## 2017-03-25 DIAGNOSIS — Z85828 Personal history of other malignant neoplasm of skin: Secondary | ICD-10-CM | POA: Diagnosis not present

## 2017-03-29 DIAGNOSIS — R05 Cough: Secondary | ICD-10-CM | POA: Diagnosis not present

## 2017-03-29 DIAGNOSIS — Z6823 Body mass index (BMI) 23.0-23.9, adult: Secondary | ICD-10-CM | POA: Diagnosis not present

## 2017-03-29 DIAGNOSIS — J209 Acute bronchitis, unspecified: Secondary | ICD-10-CM | POA: Diagnosis not present

## 2017-04-21 DIAGNOSIS — M25572 Pain in left ankle and joints of left foot: Secondary | ICD-10-CM | POA: Diagnosis not present

## 2017-04-28 DIAGNOSIS — M25551 Pain in right hip: Secondary | ICD-10-CM | POA: Diagnosis not present

## 2017-04-28 DIAGNOSIS — M7061 Trochanteric bursitis, right hip: Secondary | ICD-10-CM | POA: Diagnosis not present

## 2017-08-04 DIAGNOSIS — Z1231 Encounter for screening mammogram for malignant neoplasm of breast: Secondary | ICD-10-CM | POA: Diagnosis not present

## 2017-08-05 DIAGNOSIS — H5211 Myopia, right eye: Secondary | ICD-10-CM | POA: Diagnosis not present

## 2017-08-05 DIAGNOSIS — H04123 Dry eye syndrome of bilateral lacrimal glands: Secondary | ICD-10-CM | POA: Diagnosis not present

## 2017-08-05 DIAGNOSIS — H2513 Age-related nuclear cataract, bilateral: Secondary | ICD-10-CM | POA: Diagnosis not present

## 2017-08-05 DIAGNOSIS — H40053 Ocular hypertension, bilateral: Secondary | ICD-10-CM | POA: Diagnosis not present

## 2017-08-08 DIAGNOSIS — Z23 Encounter for immunization: Secondary | ICD-10-CM | POA: Diagnosis not present

## 2017-09-03 DIAGNOSIS — H40013 Open angle with borderline findings, low risk, bilateral: Secondary | ICD-10-CM | POA: Diagnosis not present

## 2017-09-03 DIAGNOSIS — H0012 Chalazion right lower eyelid: Secondary | ICD-10-CM | POA: Diagnosis not present

## 2017-09-03 DIAGNOSIS — H0011 Chalazion right upper eyelid: Secondary | ICD-10-CM | POA: Diagnosis not present

## 2017-09-03 DIAGNOSIS — H40053 Ocular hypertension, bilateral: Secondary | ICD-10-CM | POA: Diagnosis not present

## 2017-10-06 DIAGNOSIS — H25813 Combined forms of age-related cataract, bilateral: Secondary | ICD-10-CM | POA: Diagnosis not present

## 2017-10-06 DIAGNOSIS — H01001 Unspecified blepharitis right upper eyelid: Secondary | ICD-10-CM | POA: Diagnosis not present

## 2017-10-06 DIAGNOSIS — H40053 Ocular hypertension, bilateral: Secondary | ICD-10-CM | POA: Diagnosis not present

## 2017-10-06 DIAGNOSIS — H01005 Unspecified blepharitis left lower eyelid: Secondary | ICD-10-CM | POA: Diagnosis not present

## 2017-12-13 DIAGNOSIS — H2513 Age-related nuclear cataract, bilateral: Secondary | ICD-10-CM | POA: Diagnosis not present

## 2017-12-13 DIAGNOSIS — M25571 Pain in right ankle and joints of right foot: Secondary | ICD-10-CM | POA: Diagnosis not present

## 2017-12-13 DIAGNOSIS — H0100A Unspecified blepharitis right eye, upper and lower eyelids: Secondary | ICD-10-CM | POA: Diagnosis not present

## 2017-12-13 DIAGNOSIS — H40053 Ocular hypertension, bilateral: Secondary | ICD-10-CM | POA: Diagnosis not present

## 2017-12-13 DIAGNOSIS — H0100B Unspecified blepharitis left eye, upper and lower eyelids: Secondary | ICD-10-CM | POA: Diagnosis not present

## 2017-12-21 DIAGNOSIS — Z6824 Body mass index (BMI) 24.0-24.9, adult: Secondary | ICD-10-CM | POA: Diagnosis not present

## 2017-12-21 DIAGNOSIS — Z124 Encounter for screening for malignant neoplasm of cervix: Secondary | ICD-10-CM | POA: Diagnosis not present

## 2017-12-28 DIAGNOSIS — Z23 Encounter for immunization: Secondary | ICD-10-CM | POA: Diagnosis not present

## 2018-01-17 DIAGNOSIS — M25562 Pain in left knee: Secondary | ICD-10-CM | POA: Diagnosis not present

## 2018-03-21 DIAGNOSIS — M25551 Pain in right hip: Secondary | ICD-10-CM | POA: Diagnosis not present

## 2018-04-12 ENCOUNTER — Ambulatory Visit: Payer: Medicare Other | Admitting: Sports Medicine

## 2018-04-28 ENCOUNTER — Encounter: Payer: Self-pay | Admitting: Sports Medicine

## 2018-04-28 ENCOUNTER — Ambulatory Visit (INDEPENDENT_AMBULATORY_CARE_PROVIDER_SITE_OTHER): Payer: Medicare Other | Admitting: Sports Medicine

## 2018-04-28 VITALS — BP 112/76 | Ht 68.0 in | Wt 155.0 lb

## 2018-04-28 DIAGNOSIS — G5701 Lesion of sciatic nerve, right lower limb: Secondary | ICD-10-CM

## 2018-04-28 MED ORDER — NITROGLYCERIN 0.2 MG/HR TD PT24: MEDICATED_PATCH | TRANSDERMAL | 1 refills | Status: DC

## 2018-04-28 NOTE — Progress Notes (Signed)
   Subjective:    Patient ID: Desiree Byrd, female    DOB: September 05, 1951, 67 y.o.   MRN: [id number]  HPI Desiree Byrd is a 67 year old female that presents to clinic complaining of right hip pain. She reports that the pain began approximately 1.5 years ago. She did have a fall onto that hip around that time, but two subsequent negative XRs. 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   She was initially diagnosed with greater trochanter bursitis and received an injection, which did not improve her pain. Following that, she was seen by a physical therapist who thought that it may be IT band related. She was given stretches and told to do trigger point massage, which seem to improve her pain over last summer. Over the course of this winter, her hip pain began to bother her much more. She was seen by Dr. Fredonia Highland who thought it might be secondary to inflamed muscles and gave voltaren gel. She reports that this did not help at all.  XR of hip was normal.  She describes a sensation of "something rolling over her hip" when she tries to move it outwards in a figure 4 position. It is tight, hurts, and then releases. She is unable to sleep lying on her right hip. No numbness or tingling.    Review of Systems As per HPI Rare sciatic sxs. No weakness in lower extremes    Objective:   Physical Exam General: well appearing female in no acute distress BP 112/76   Ht 5\' 8"  (1.727 m)   Wt 155 lb (70.3 kg)   BMI 23.57 kg/m   CV: warm and well perfused Lungs: comfortable work of breathing MSK: Full range of motion of hip joint but pain with hip rotator testing to resistance and with standig hip rotatio . 5/5 hip abductor strength, but gluteus maximus weakness noted. Tenderness to palpation at insertion point of rotator muscles on post aspect of GT.   Ultrasound R Hip: spur of greater trochanter noted at insertion of piriformis  hypoechoic areas c/w swelling and  inflammation present in the piriformis muscle.at attachment to Greater trochanter and in distal muscle Hypoechoic change at fascial plane between piriformis and gemellus  Impression; Chronic piriformis tendinopathy with spurring  Ultrasound and interpretation by Wolfgang Phoenix. Aaleyah Witherow, MD       Assessment & Plan:  Desiree Byrd is a 67 year old female that complains of 1.5 years of right hip pain with exam and ultrasound consistent with piriformis syndrome and spur of the greater trochanter.   1. Piriformis syndrome of right side - Start on nitroglycerin protocol, may increase to 1/2 patch if tolerates well - Provided and demonstrated three exercises to improve rotator muscle strength - Provided two tennis drills to improve strength - Plan to return to clinic in 8 weeks for repeat ultrasound  I observed and examined the patient with the resident and agree with assessment and plan.  Note reviewed and modified by me. Stefanie Libel, MD

## 2018-04-28 NOTE — Patient Instructions (Addendum)
Nitroglycerin Protocol   Apply 1/4 nitroglycerin patch to affected area daily. You may increase to 1/2 patch if doing well without side effects.   Change position of patch within the affected area every 24 hours.  You may experience a headache during the first 1-2 weeks of using the patch, these should subside.  If you experience headaches after beginning nitroglycerin patch treatment, you may take your preferred over the counter pain reliever.  Another side effect of the nitroglycerin patch is skin irritation or rash related to patch adhesive.  Please notify our office if you develop more severe headaches or rash, and stop the patch.  Tendon healing with nitroglycerin patch may require 12 to 24 weeks depending on the extent of injury.  Men should not use if taking Viagra, Cialis, or Levitra.   Do not use if you have migraines or rosacea.    Your exam and ultrasound were consistent with piriformis syndrome.  Below are the exercises to complete at home:  1. Standing hip rotation. Stand on one leg with other knee at 90 degrees, rotate all the way inward then outward. 3 sets of 10.  2. Side lying clamp shell. Lift leg with it bent, then open and close. 3 sets of 10.  3. Step up hip rotation. Face forward then rotate to step up on short stool, then back down. 3 sets of 10.

## 2018-05-11 DIAGNOSIS — M25561 Pain in right knee: Secondary | ICD-10-CM | POA: Diagnosis not present

## 2018-06-17 DIAGNOSIS — R0989 Other specified symptoms and signs involving the circulatory and respiratory systems: Secondary | ICD-10-CM | POA: Diagnosis not present

## 2018-06-17 DIAGNOSIS — Z6823 Body mass index (BMI) 23.0-23.9, adult: Secondary | ICD-10-CM | POA: Diagnosis not present

## 2018-06-17 DIAGNOSIS — R221 Localized swelling, mass and lump, neck: Secondary | ICD-10-CM | POA: Diagnosis not present

## 2018-06-23 ENCOUNTER — Other Ambulatory Visit (HOSPITAL_COMMUNITY): Payer: Self-pay | Admitting: Internal Medicine

## 2018-06-23 ENCOUNTER — Ambulatory Visit (HOSPITAL_COMMUNITY)
Admission: RE | Admit: 2018-06-23 | Discharge: 2018-06-23 | Disposition: A | Discharge status: Home / Self Care | Payer: Medicare Other | Source: Ambulatory Visit | Attending: Family | Admitting: Family

## 2018-06-23 DIAGNOSIS — R0989 Other specified symptoms and signs involving the circulatory and respiratory systems: Secondary | ICD-10-CM | POA: Insufficient documentation

## 2018-06-23 DIAGNOSIS — I6523 Occlusion and stenosis of bilateral carotid arteries: Secondary | ICD-10-CM | POA: Insufficient documentation

## 2018-06-29 DIAGNOSIS — R82998 Other abnormal findings in urine: Secondary | ICD-10-CM | POA: Diagnosis not present

## 2018-06-29 DIAGNOSIS — Z79899 Other long term (current) drug therapy: Secondary | ICD-10-CM | POA: Diagnosis not present

## 2018-06-30 ENCOUNTER — Ambulatory Visit: Payer: Medicare Other | Admitting: Sports Medicine

## 2018-07-04 ENCOUNTER — Encounter: Payer: Self-pay | Admitting: Vascular Surgery

## 2018-07-04 ENCOUNTER — Ambulatory Visit (INDEPENDENT_AMBULATORY_CARE_PROVIDER_SITE_OTHER): Payer: Medicare Other | Admitting: Vascular Surgery

## 2018-07-04 ENCOUNTER — Other Ambulatory Visit: Payer: Self-pay

## 2018-07-04 VITALS — BP 123/73 | HR 84 | Resp 18 | Ht 68.0 in | Wt 155.3 lb

## 2018-07-04 DIAGNOSIS — I6521 Occlusion and stenosis of right carotid artery: Secondary | ICD-10-CM

## 2018-07-04 NOTE — Progress Notes (Signed)
Patient name: Desiree Byrd MRN: [id number] DOB: October 28, 1951 Sex: female   REASON FOR CONSULT:    Carotid disease.  The consult is requested by Dr. Ardeth Perfect.  HPI:   Desiree Byrd is a pleasant 67 y.o. female, who is having some muscle twitches in her left neck.  There was some throbbing in her blood vessels and palpitations and this prompted a carotid duplex scan.  This showed a 40 to 59% right carotid stenosis and the patient was sent for vascular consultation.  Patient is right-handed.  She denies any history of stroke, TIAs, expressive or receptive aphasia, or amaurosis fugax.  She is not on aspirin.  She does not take a statin.  She is not a smoker.  Past Medical History:  Diagnosis Date  . Arthritis   . Carotid artery occlusion   . Fasting hyperglycemia   . Lung collapse    x2    Family History  Problem Relation Age of Onset  . Colon polyps Mother   . Hypertension Mother   . Breast cancer Mother        in 49s  . Lung cancer Mother   . Heart disease Mother   . Multiple sclerosis Father        Deceased  . Stroke Neg Hx   . Colon cancer Neg Hx     SOCIAL HISTORY: Social History   Socioeconomic History  . Marital status: Married    Spouse name: Not on file  . Number of children: Not on file  . Years of education: Not on file  . Highest education level: Not on file  Occupational History  . Occupation: Engineering geologist  Social Needs  . Financial resource strain: Not on file  . Food insecurity:    Worry: Not on file    Inability: Not on file  . Transportation needs:    Medical: Not on file    Non-medical: Not on file  Tobacco Use  . Smoking status: Never Smoker  . Smokeless tobacco: Never Used  Substance and Sexual Activity  . Alcohol use: Yes    Comment:  socially  . Drug use: No  . Sexual activity: Not on file  Lifestyle  . Physical activity:    Days per week: Not on file    Minutes per session: Not on file  . Stress: Not on file  Relationships    . Social connections:    Talks on phone: Not on file    Gets together: Not on file    Attends religious service: Not on file    Active member of club or organization: Not on file    Attends meetings of clubs or organizations: Not on file    Relationship status: Not on file  . Intimate partner violence:    Fear of current or ex partner: Not on file    Emotionally abused: Not on file    Physically abused: Not on file    Forced sexual activity: Not on file  Other Topics Concern  . Not on file  Social History Narrative  . Not on file    No Known Allergies  Current Outpatient Medications  Medication Sig Dispense Refill  . diclofenac sodium (VOLTAREN) 1 % GEL APP 1 TO 3 GRAMS EXT AA UP TO TID PRN  3  . FLUZONE HIGH-DOSE 0.5 ML SUSY     . Lysine 1000 MG TABS Take 1 tablet by mouth daily.    . naproxen sodium (ALEVE) 220  MG tablet Take 220 mg by mouth 2 (two) times daily as needed.    . nitroGLYCERIN (NITRODUR - DOSED IN MG/24 HR) 0.2 mg/hr patch Place 1/4 patch to the affected area daily. 30 patch 1  . ofloxacin (OCUFLOX) 0.3 % ophthalmic solution INSTILL 1 TO 2 DROPS INTO OU QID FOR 10 TO 14 DAYS  0  . celecoxib (CELEBREX) 200 MG capsule TK 1 C PO QD WF  1   No current facility-administered medications for this visit.     REVIEW OF SYSTEMS:  [X]  denotes positive finding, [ ]  denotes negative finding Cardiac  Comments:  Chest pain or chest pressure:    Shortness of breath upon exertion:    Short of breath when lying flat:    Irregular heart rhythm:        Vascular    Pain in calf, thigh, or hip brought on by ambulation:    Pain in feet at night that wakes you up from your sleep:     Blood clot in your veins:    Leg swelling:         Pulmonary    Oxygen at home:    Productive cough:     Wheezing:         Neurologic    Sudden weakness in arms or legs:     Sudden numbness in arms or legs:     Sudden onset of difficulty speaking or slurred speech:    Temporary loss of  vision in one eye:     Problems with dizziness:         Gastrointestinal    Blood in stool:     Vomited blood:         Genitourinary    Burning when urinating:     Blood in urine:        Psychiatric    Major depression:         Hematologic    Bleeding problems:    Problems with blood clotting too easily:        Skin    Rashes or ulcers:        Constitutional    Fever or chills:     PHYSICAL EXAM:   Vitals:   07/04/18 1349 07/04/18 1350  BP: 138/78 123/73  Pulse: 84   Resp: 18   SpO2: 99%   Weight: 155 lb 4.8 oz (70.4 kg)   Height: 5\' 8"  (1.727 m)     GENERAL: The patient is a well-nourished female, in no acute distress. The vital signs are documented above. CARDIAC: There is a regular rate and rhythm.  VASCULAR: I do not detect carotid bruits. She has palpable posterior tibial pulses bilaterally. She has no significant lower extremity swelling. PULMONARY: There is good air exchange bilaterally without wheezing or rales. ABDOMEN: Soft and non-tender with normal pitched bowel sounds.  MUSCULOSKELETAL: There are no major deformities or cyanosis. NEUROLOGIC: No focal weakness or paresthesias are detected. SKIN: There are no ulcers or rashes noted. PSYCHIATRIC: The patient has a normal affect.  DATA:    CAROTID DUPLEX: I have reviewed the carotid duplex scan that was done on 06/23/2018.  On the right side there was a 40 to 59% stenosis.  On the left side there was a less than 39% stenosis.  Both vertebral arteries were patent with antegrade flow.  MEDICAL ISSUES:   ASYMPTOMATIC 40 TO 59% RIGHT CAROTID STENOSIS: I explained that we would not consider right carotid endarterectomy unless she became  symptomatic or the stenosis progressed to greater than 80%.  I have recommended that she begin taking 81 mg of aspirin daily.  In addition she will discuss statin use with her primary care physician.  Her recent lipid profile is pending.  I have ordered a follow-up carotid  duplex scan in 1 year and I will see her back at that time.  She knows to call sooner if she has problems.  [name] [facility] [phone]

## 2018-07-05 ENCOUNTER — Encounter: Payer: Self-pay | Admitting: Sports Medicine

## 2018-07-05 ENCOUNTER — Ambulatory Visit: Payer: Medicare Other | Admitting: Sports Medicine

## 2018-07-05 ENCOUNTER — Ambulatory Visit (INDEPENDENT_AMBULATORY_CARE_PROVIDER_SITE_OTHER): Payer: Medicare Other | Admitting: Sports Medicine

## 2018-07-05 DIAGNOSIS — M7051 Other bursitis of knee, right knee: Secondary | ICD-10-CM | POA: Diagnosis not present

## 2018-07-05 DIAGNOSIS — I6521 Occlusion and stenosis of right carotid artery: Secondary | ICD-10-CM | POA: Diagnosis not present

## 2018-07-05 NOTE — Assessment & Plan Note (Addendum)
Considered etiology was baker's cyst vs semitendinous membranous bursitis. Ultrasound, physical exam, and history all support that patient has semitendinous membranous bursitis and will do well with conservative measures such as rest, NSAIDS, and wearing a body-helix compression sleeve during tennis play.

## 2018-07-05 NOTE — Progress Notes (Addendum)
  Desiree Byrd - 67 y.o. female MRN [id number]  Date of birth: Mar 26, 1951  SUBJECTIVE:  Including CC & ROS.   Right Knee Pain Patient presents with known right knee arthritis and right knee pain that began about 3 months ago. 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 About 1 month ago she was seen by an orthopedic doctor who injected her knee with a corticosteroid and her pain has improved. It was at this appointment that she was made aware she may have a Baker's cyst and presents today for evaluation of posterior knee swelling and decreased knee flexion.   HISTORY: Past Medical, Surgical, Social, and Family History Reviewed & Updated per EMR.   Pertinent Historical Findings include: See HPI above. Otherwise, non-pertinent to today's visit.  DATA REVIEWED: 8/200/2019  PHYSICAL EXAM:  VS: BP:110/60  HR: bpm  TEMP: ( )  RESP:   HT:5\' 8"  (172.7 cm)   WT:155 lb (70.3 kg)  BMI:23.57 PHYSICAL EXAM: Gen: Alert and Oriented x 3, NAD MSK: Moves all four extremities; slight crepitus bilaterally at the knee joints, right knee anterior and posterior drawer test negative, varus and valgus test negative. Right knee is non-tender to palpation in the suprapatellar region, medial knee joint line, and posterior knee. Ext: no clubbing, cyanosis, or edema Neuro: No gross deficits Skin: warm, dry, intact, no rashes  Labs: Ultrasound scan of the posterior knee showed some swelling at the semitendinous membranous bursa. No baker's cyst was found. Area of fluid was found to measure approximately 2cmx1cm and not a good candidate to drain at this time.  ASSESSMENT & PLAN: See problem based charting & AVS for pt instructions.  Bursitis of right knee Considered etiology was baker's cyst vs semitendinous membranous bursitis. Ultrasound, physical exam, and history all support that patient has semitendinous membranous bursitis and will do well with conservative measures  such as rest, NSAIDS, and wearing a body-helix compression sleeve during tennis play.    Addendum/ KBF Her piriformis sxs have resolved Strength very good US shows spuir GT but less hypoechoic change and no tendon tear  Knee US showed semimembranosus- gastrocnemius burisit but is is mild and not symptomatic  Can inject in future if needed  I observed and examined the patient with the resident and agree with assessment and plan.  Note reviewed and modified by me. Ila Mcgill, MD

## 2018-07-05 NOTE — Patient Instructions (Signed)
It was great to meet you today! Thank you for letting me participate in your care!  Today, we discussed your right knee pain and swelling. We performed an ultrasound scan and found you have semitendinous bursitis. We recommend moderate rest and you can use over the counter NSAIDS (Naproxen, Ibuprofen) to help with pain and inflammation. If your pain and swelling do not improve with these conservative measures please return to the clinic.  Be well, Desiree Rutherford, DO PGY-2, [facility]   Bursitis Bursitis is when the fluid-filled sac (bursa) that covers and protects a joint is swollen (inflamed). Bursitis is most common near joints, especially the knees, elbows, hips, and shoulders. Follow these instructions at home:  Take medicines only as told by your doctor.  If you were prescribed an antibiotic medicine, finish it all even if you start to feel better.  Rest the affected area as told by your doctor. ? Keep the area raised up. ? Avoid doing things that make the pain worse.  Apply ice to the injured area: ? Place ice in a plastic bag. ? Place a towel between your skin and the bag. ? Leave the ice on for 20 minutes, 2-3 times a day.  Use splints, braces, pads, or walking aids as told by your doctor.  Keep all follow-up visits as told by your doctor. This is important. Contact a doctor if:  You have more pain with home care.  You have a fever.  You have chills. This information is not intended to replace advice given to you by your health care provider. Make sure you discuss any questions you have with your health care provider. Document Released: 04/22/2010 Document Revised: 04/09/2016 Document Reviewed: 01/22/2014 Elsevier Interactive Patient Education  2018 [employer].

## 2018-07-06 DIAGNOSIS — E7849 Other hyperlipidemia: Secondary | ICD-10-CM | POA: Diagnosis not present

## 2018-07-06 DIAGNOSIS — Z0000 Encounter for general adult medical examination without abnormal findings: Secondary | ICD-10-CM | POA: Diagnosis not present

## 2018-07-06 DIAGNOSIS — I6521 Occlusion and stenosis of right carotid artery: Secondary | ICD-10-CM | POA: Diagnosis not present

## 2018-07-06 DIAGNOSIS — Z1389 Encounter for screening for other disorder: Secondary | ICD-10-CM | POA: Diagnosis not present

## 2018-07-06 DIAGNOSIS — M25562 Pain in left knee: Secondary | ICD-10-CM | POA: Diagnosis not present

## 2018-07-06 DIAGNOSIS — Z6823 Body mass index (BMI) 23.0-23.9, adult: Secondary | ICD-10-CM | POA: Diagnosis not present

## 2018-07-07 DIAGNOSIS — H04123 Dry eye syndrome of bilateral lacrimal glands: Secondary | ICD-10-CM | POA: Diagnosis not present

## 2018-07-07 DIAGNOSIS — H25813 Combined forms of age-related cataract, bilateral: Secondary | ICD-10-CM | POA: Diagnosis not present

## 2018-07-07 DIAGNOSIS — H40053 Ocular hypertension, bilateral: Secondary | ICD-10-CM | POA: Diagnosis not present

## 2018-07-08 DIAGNOSIS — Z1212 Encounter for screening for malignant neoplasm of rectum: Secondary | ICD-10-CM | POA: Diagnosis not present

## 2018-08-20 DIAGNOSIS — Z23 Encounter for immunization: Secondary | ICD-10-CM | POA: Diagnosis not present

## 2018-08-25 DIAGNOSIS — L918 Other hypertrophic disorders of the skin: Secondary | ICD-10-CM | POA: Diagnosis not present

## 2018-08-25 DIAGNOSIS — L820 Inflamed seborrheic keratosis: Secondary | ICD-10-CM | POA: Diagnosis not present

## 2018-08-25 DIAGNOSIS — Z85828 Personal history of other malignant neoplasm of skin: Secondary | ICD-10-CM | POA: Diagnosis not present

## 2018-08-26 DIAGNOSIS — E7849 Other hyperlipidemia: Secondary | ICD-10-CM | POA: Diagnosis not present

## 2018-12-16 DIAGNOSIS — M25571 Pain in right ankle and joints of right foot: Secondary | ICD-10-CM | POA: Diagnosis not present

## 2018-12-16 DIAGNOSIS — M79671 Pain in right foot: Secondary | ICD-10-CM | POA: Diagnosis not present

## 2018-12-19 DIAGNOSIS — H2513 Age-related nuclear cataract, bilateral: Secondary | ICD-10-CM | POA: Diagnosis not present

## 2018-12-19 DIAGNOSIS — H40053 Ocular hypertension, bilateral: Secondary | ICD-10-CM | POA: Diagnosis not present

## 2018-12-19 DIAGNOSIS — H5211 Myopia, right eye: Secondary | ICD-10-CM | POA: Diagnosis not present

## 2018-12-19 DIAGNOSIS — H0015 Chalazion left lower eyelid: Secondary | ICD-10-CM | POA: Diagnosis not present

## 2018-12-20 ENCOUNTER — Ambulatory Visit (INDEPENDENT_AMBULATORY_CARE_PROVIDER_SITE_OTHER): Payer: Medicare Other | Admitting: Sports Medicine

## 2018-12-20 ENCOUNTER — Encounter: Payer: Self-pay | Admitting: Sports Medicine

## 2018-12-20 ENCOUNTER — Ambulatory Visit: Payer: Self-pay

## 2018-12-20 VITALS — BP 130/68 | Ht 68.0 in | Wt 155.0 lb

## 2018-12-20 DIAGNOSIS — M25571 Pain in right ankle and joints of right foot: Secondary | ICD-10-CM | POA: Insufficient documentation

## 2018-12-20 DIAGNOSIS — M79671 Pain in right foot: Secondary | ICD-10-CM | POA: Diagnosis not present

## 2018-12-20 NOTE — Assessment & Plan Note (Signed)
She may be lucky enough that this will resolve with conservative care However, she will go ahead with the MRI to confirm She will get opinions from Drs Doran Durand and Lucia Gaskins about removal with arthroscopy if they confirm these findings  Continue orthotics for other foot issues and repadded these today

## 2018-12-20 NOTE — Patient Instructions (Signed)
Dr Radene Journey [facility] [address] [place] [phone] Monday Feb 10th 345p Arrival time is 315p

## 2018-12-20 NOTE — Progress Notes (Signed)
CC: Sharp RT ankle pain  Patient felt a sharp pain in anterior ankle while leaning back on flexed ankle during a tennis serve Pain is sharp 8/10 Lasts a few momemnts Ankel feels stiff Pain can resolve and return  Saw PA at Dr [employer] office XR was not diagnositc Scheduled for MRI  Past HX In orthoitcs for foot breakdown These help her with tennis  ROS No ankle swelling No locking Sharp pain shoots down on dorsum of foot  PE Older W F in NAD/ fit BP 130/68   Ht 5\' 8"  (1.727 m)   Wt 155 lb (70.3 kg)   BMI 23.57 kg/m   RT ankle Ankle: No visible erythema or swelling. Range of motion is full in all directions. Strength is 5/5 in all directions. Stable lateral and medial ligaments; squeeze test and kleiger test unremarkable; Talar dome nontender; No pain at base of 5th MT; No tenderness over cuboid; No tenderness over N spot or navicular prominence No tenderness on posterior aspects of lateral and medial malleolus No sign of peroneal tendon subluxations; Negative tarsal tunnel tinel's Able to walk 4 steps.  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 Oneida Alar, MD

## 2018-12-23 DIAGNOSIS — M25571 Pain in right ankle and joints of right foot: Secondary | ICD-10-CM | POA: Diagnosis not present

## 2018-12-26 DIAGNOSIS — M25571 Pain in right ankle and joints of right foot: Secondary | ICD-10-CM | POA: Diagnosis not present

## 2018-12-28 DIAGNOSIS — Z1231 Encounter for screening mammogram for malignant neoplasm of breast: Secondary | ICD-10-CM | POA: Diagnosis not present

## 2018-12-28 DIAGNOSIS — Z6823 Body mass index (BMI) 23.0-23.9, adult: Secondary | ICD-10-CM | POA: Diagnosis not present

## 2018-12-28 DIAGNOSIS — Z01419 Encounter for gynecological examination (general) (routine) without abnormal findings: Secondary | ICD-10-CM | POA: Diagnosis not present

## 2019-01-04 DIAGNOSIS — M25571 Pain in right ankle and joints of right foot: Secondary | ICD-10-CM | POA: Diagnosis not present

## 2019-01-06 DIAGNOSIS — M25571 Pain in right ankle and joints of right foot: Secondary | ICD-10-CM | POA: Diagnosis not present

## 2019-01-10 DIAGNOSIS — M25571 Pain in right ankle and joints of right foot: Secondary | ICD-10-CM | POA: Diagnosis not present

## 2019-01-10 DIAGNOSIS — M25371 Other instability, right ankle: Secondary | ICD-10-CM | POA: Diagnosis not present

## 2019-01-12 DIAGNOSIS — M25371 Other instability, right ankle: Secondary | ICD-10-CM | POA: Diagnosis not present

## 2019-01-12 DIAGNOSIS — M25571 Pain in right ankle and joints of right foot: Secondary | ICD-10-CM | POA: Diagnosis not present

## 2019-01-17 DIAGNOSIS — M25571 Pain in right ankle and joints of right foot: Secondary | ICD-10-CM | POA: Diagnosis not present

## 2019-01-17 DIAGNOSIS — M25371 Other instability, right ankle: Secondary | ICD-10-CM | POA: Diagnosis not present

## 2019-01-19 DIAGNOSIS — M25571 Pain in right ankle and joints of right foot: Secondary | ICD-10-CM | POA: Diagnosis not present

## 2019-01-19 DIAGNOSIS — M25371 Other instability, right ankle: Secondary | ICD-10-CM | POA: Diagnosis not present

## 2019-01-24 DIAGNOSIS — M25371 Other instability, right ankle: Secondary | ICD-10-CM | POA: Diagnosis not present

## 2019-01-24 DIAGNOSIS — M25571 Pain in right ankle and joints of right foot: Secondary | ICD-10-CM | POA: Diagnosis not present

## 2019-01-26 DIAGNOSIS — M25571 Pain in right ankle and joints of right foot: Secondary | ICD-10-CM | POA: Diagnosis not present

## 2019-01-26 DIAGNOSIS — M25671 Stiffness of right ankle, not elsewhere classified: Secondary | ICD-10-CM | POA: Diagnosis not present

## 2019-01-31 DIAGNOSIS — M25371 Other instability, right ankle: Secondary | ICD-10-CM | POA: Diagnosis not present

## 2019-01-31 DIAGNOSIS — M25571 Pain in right ankle and joints of right foot: Secondary | ICD-10-CM | POA: Diagnosis not present

## 2019-05-24 DIAGNOSIS — L570 Actinic keratosis: Secondary | ICD-10-CM | POA: Diagnosis not present

## 2019-05-24 DIAGNOSIS — Z85828 Personal history of other malignant neoplasm of skin: Secondary | ICD-10-CM | POA: Diagnosis not present

## 2019-05-29 DIAGNOSIS — H25813 Combined forms of age-related cataract, bilateral: Secondary | ICD-10-CM | POA: Diagnosis not present

## 2019-05-29 DIAGNOSIS — H40053 Ocular hypertension, bilateral: Secondary | ICD-10-CM | POA: Diagnosis not present

## 2019-05-29 DIAGNOSIS — H0100A Unspecified blepharitis right eye, upper and lower eyelids: Secondary | ICD-10-CM | POA: Diagnosis not present

## 2019-05-29 DIAGNOSIS — H0100B Unspecified blepharitis left eye, upper and lower eyelids: Secondary | ICD-10-CM | POA: Diagnosis not present

## 2019-06-30 DIAGNOSIS — E7849 Other hyperlipidemia: Secondary | ICD-10-CM | POA: Diagnosis not present

## 2019-06-30 DIAGNOSIS — R82998 Other abnormal findings in urine: Secondary | ICD-10-CM | POA: Diagnosis not present

## 2019-07-19 DIAGNOSIS — E785 Hyperlipidemia, unspecified: Secondary | ICD-10-CM | POA: Diagnosis not present

## 2019-07-19 DIAGNOSIS — M1990 Unspecified osteoarthritis, unspecified site: Secondary | ICD-10-CM | POA: Diagnosis not present

## 2019-07-19 DIAGNOSIS — Z0000 Encounter for general adult medical examination without abnormal findings: Secondary | ICD-10-CM | POA: Diagnosis not present

## 2019-07-19 DIAGNOSIS — Z1331 Encounter for screening for depression: Secondary | ICD-10-CM | POA: Diagnosis not present

## 2019-07-19 DIAGNOSIS — I6521 Occlusion and stenosis of right carotid artery: Secondary | ICD-10-CM | POA: Diagnosis not present

## 2019-08-16 DIAGNOSIS — M25571 Pain in right ankle and joints of right foot: Secondary | ICD-10-CM | POA: Diagnosis not present

## 2019-08-17 DIAGNOSIS — M25371 Other instability, right ankle: Secondary | ICD-10-CM | POA: Diagnosis not present

## 2019-08-17 DIAGNOSIS — M25571 Pain in right ankle and joints of right foot: Secondary | ICD-10-CM | POA: Diagnosis not present

## 2019-08-24 DIAGNOSIS — Z23 Encounter for immunization: Secondary | ICD-10-CM | POA: Diagnosis not present

## 2019-08-24 DIAGNOSIS — M25371 Other instability, right ankle: Secondary | ICD-10-CM | POA: Diagnosis not present

## 2019-08-24 DIAGNOSIS — M25571 Pain in right ankle and joints of right foot: Secondary | ICD-10-CM | POA: Diagnosis not present

## 2019-09-04 DIAGNOSIS — M25571 Pain in right ankle and joints of right foot: Secondary | ICD-10-CM | POA: Diagnosis not present

## 2019-09-04 DIAGNOSIS — M25371 Other instability, right ankle: Secondary | ICD-10-CM | POA: Diagnosis not present

## 2019-10-11 DIAGNOSIS — M25562 Pain in left knee: Secondary | ICD-10-CM | POA: Diagnosis not present

## 2019-12-07 ENCOUNTER — Ambulatory Visit: Payer: Medicare Other | Attending: Internal Medicine

## 2019-12-07 ENCOUNTER — Ambulatory Visit: Payer: Medicare Other

## 2019-12-07 DIAGNOSIS — Z23 Encounter for immunization: Secondary | ICD-10-CM | POA: Insufficient documentation

## 2019-12-07 NOTE — Progress Notes (Signed)
   Covid-19 Vaccination Clinic  Name:  Desiree Byrd    MRN: [id number] DOB: 07-Jun-1951  12/07/2019  [name] was observed post Covid-19 immunization for 15 minutes without incidence. She was provided with Vaccine Information Sheet and instruction to access the V-Safe system.   [name] was instructed to call 911 with any severe reactions post vaccine: Marland Kitchen Difficulty breathing  . Swelling of your face and throat  . A fast heartbeat  . A bad rash all over your body  . Dizziness and weakness    Immunizations Administered    Name Date Dose VIS Date Route   Pfizer COVID-19 Vaccine 12/07/2019  8:40 AM 0.3 mL 10/27/2019 Intramuscular   Manufacturer: [place]   Lot: [id number]   [place]: [id number]

## 2019-12-14 DIAGNOSIS — H40053 Ocular hypertension, bilateral: Secondary | ICD-10-CM | POA: Diagnosis not present

## 2019-12-14 DIAGNOSIS — H25813 Combined forms of age-related cataract, bilateral: Secondary | ICD-10-CM | POA: Diagnosis not present

## 2019-12-14 DIAGNOSIS — H5213 Myopia, bilateral: Secondary | ICD-10-CM | POA: Diagnosis not present

## 2019-12-14 DIAGNOSIS — H40013 Open angle with borderline findings, low risk, bilateral: Secondary | ICD-10-CM | POA: Diagnosis not present

## 2019-12-26 DIAGNOSIS — H40051 Ocular hypertension, right eye: Secondary | ICD-10-CM | POA: Diagnosis not present

## 2019-12-26 DIAGNOSIS — H25811 Combined forms of age-related cataract, right eye: Secondary | ICD-10-CM | POA: Diagnosis not present

## 2019-12-26 DIAGNOSIS — H268 Other specified cataract: Secondary | ICD-10-CM | POA: Diagnosis not present

## 2019-12-26 DIAGNOSIS — H40001 Preglaucoma, unspecified, right eye: Secondary | ICD-10-CM | POA: Diagnosis not present

## 2019-12-27 ENCOUNTER — Ambulatory Visit: Payer: Medicare Other | Attending: Internal Medicine

## 2019-12-27 ENCOUNTER — Ambulatory Visit: Payer: Medicare Other

## 2019-12-27 DIAGNOSIS — Z23 Encounter for immunization: Secondary | ICD-10-CM | POA: Insufficient documentation

## 2019-12-27 NOTE — Progress Notes (Signed)
   Covid-19 Vaccination Clinic  Name:  Desiree Byrd    MRN: [id number] DOB: 1951/10/18  12/27/2019  [name] was observed post Covid-19 immunization for 15 minutes without incidence. She was provided with Vaccine Information Sheet and instruction to access the V-Safe system.   [name] was instructed to call 911 with any severe reactions post vaccine: Marland Kitchen Difficulty breathing  . Swelling of your face and throat  . A fast heartbeat  . A bad rash all over your body  . Dizziness and weakness    Immunizations Administered    Name Date Dose VIS Date Route   Pfizer COVID-19 Vaccine 12/27/2019 12:24 PM 0.3 mL 10/27/2019 Intramuscular   Manufacturer: [place]   Lot: [id number]   [place]: [id number]

## 2019-12-28 ENCOUNTER — Ambulatory Visit: Payer: Medicare Other

## 2020-01-30 DIAGNOSIS — H40052 Ocular hypertension, left eye: Secondary | ICD-10-CM | POA: Diagnosis not present

## 2020-01-30 DIAGNOSIS — H25812 Combined forms of age-related cataract, left eye: Secondary | ICD-10-CM | POA: Diagnosis not present

## 2020-01-30 DIAGNOSIS — H268 Other specified cataract: Secondary | ICD-10-CM | POA: Diagnosis not present

## 2020-01-30 DIAGNOSIS — H40002 Preglaucoma, unspecified, left eye: Secondary | ICD-10-CM | POA: Diagnosis not present

## 2020-03-04 DIAGNOSIS — Z1231 Encounter for screening mammogram for malignant neoplasm of breast: Secondary | ICD-10-CM | POA: Diagnosis not present

## 2020-03-18 DIAGNOSIS — Z6824 Body mass index (BMI) 24.0-24.9, adult: Secondary | ICD-10-CM | POA: Diagnosis not present

## 2020-03-18 DIAGNOSIS — Z01419 Encounter for gynecological examination (general) (routine) without abnormal findings: Secondary | ICD-10-CM | POA: Diagnosis not present

## 2020-03-18 DIAGNOSIS — M1990 Unspecified osteoarthritis, unspecified site: Secondary | ICD-10-CM | POA: Insufficient documentation

## 2020-03-21 DIAGNOSIS — Z85828 Personal history of other malignant neoplasm of skin: Secondary | ICD-10-CM | POA: Diagnosis not present

## 2020-03-21 DIAGNOSIS — L814 Other melanin hyperpigmentation: Secondary | ICD-10-CM | POA: Diagnosis not present

## 2020-03-21 DIAGNOSIS — L579 Skin changes due to chronic exposure to nonionizing radiation, unspecified: Secondary | ICD-10-CM | POA: Diagnosis not present

## 2020-04-19 ENCOUNTER — Other Ambulatory Visit: Payer: Self-pay | Admitting: *Deleted

## 2020-04-19 DIAGNOSIS — I6521 Occlusion and stenosis of right carotid artery: Secondary | ICD-10-CM

## 2020-04-24 ENCOUNTER — Ambulatory Visit: Payer: Medicare Other | Admitting: Vascular Surgery

## 2020-04-24 ENCOUNTER — Encounter (HOSPITAL_COMMUNITY): Payer: Medicare Other

## 2020-06-05 ENCOUNTER — Ambulatory Visit: Payer: Medicare Other | Admitting: Vascular Surgery

## 2020-06-05 ENCOUNTER — Encounter (HOSPITAL_COMMUNITY): Payer: Medicare Other

## 2020-08-01 DIAGNOSIS — E785 Hyperlipidemia, unspecified: Secondary | ICD-10-CM | POA: Diagnosis not present

## 2020-08-12 DIAGNOSIS — Z23 Encounter for immunization: Secondary | ICD-10-CM | POA: Diagnosis not present

## 2020-08-14 ENCOUNTER — Other Ambulatory Visit: Payer: Self-pay

## 2020-08-14 DIAGNOSIS — I6521 Occlusion and stenosis of right carotid artery: Secondary | ICD-10-CM

## 2020-08-20 ENCOUNTER — Ambulatory Visit: Payer: Medicare Other | Attending: Internal Medicine

## 2020-08-20 DIAGNOSIS — Z23 Encounter for immunization: Secondary | ICD-10-CM

## 2020-08-20 NOTE — Progress Notes (Signed)
   Covid-19 Vaccination Clinic  Name:  Desiree Byrd    MRN: [id number] DOB: 19-Oct-1951  08/20/2020  Ms. Desiree Byrd was observed post Covid-19 immunization for 15 minutes without incident. She was provided with Vaccine Information Sheet and instruction to access the V-Safe system.   Ms. Desiree Byrd was instructed to call 911 with any severe reactions post vaccine: Marland Kitchen Difficulty breathing  . Swelling of face and throat  . A fast heartbeat  . A bad rash all over body  . Dizziness and weakness

## 2020-08-21 DIAGNOSIS — R82998 Other abnormal findings in urine: Secondary | ICD-10-CM | POA: Diagnosis not present

## 2020-08-21 DIAGNOSIS — I6523 Occlusion and stenosis of bilateral carotid arteries: Secondary | ICD-10-CM | POA: Diagnosis not present

## 2020-08-21 DIAGNOSIS — Z0000 Encounter for general adult medical examination without abnormal findings: Secondary | ICD-10-CM | POA: Diagnosis not present

## 2020-08-21 DIAGNOSIS — E785 Hyperlipidemia, unspecified: Secondary | ICD-10-CM | POA: Diagnosis not present

## 2020-08-21 DIAGNOSIS — M1990 Unspecified osteoarthritis, unspecified site: Secondary | ICD-10-CM | POA: Diagnosis not present

## 2020-08-22 DIAGNOSIS — Z961 Presence of intraocular lens: Secondary | ICD-10-CM | POA: Diagnosis not present

## 2020-08-22 DIAGNOSIS — H40053 Ocular hypertension, bilateral: Secondary | ICD-10-CM | POA: Diagnosis not present

## 2020-08-26 DIAGNOSIS — Z1212 Encounter for screening for malignant neoplasm of rectum: Secondary | ICD-10-CM | POA: Diagnosis not present

## 2020-08-28 ENCOUNTER — Ambulatory Visit (HOSPITAL_COMMUNITY)
Admission: RE | Admit: 2020-08-28 | Discharge: 2020-08-28 | Disposition: A | Discharge status: Home / Self Care | Payer: Medicare Other | Source: Ambulatory Visit | Attending: Vascular Surgery | Admitting: Vascular Surgery

## 2020-08-28 ENCOUNTER — Ambulatory Visit (INDEPENDENT_AMBULATORY_CARE_PROVIDER_SITE_OTHER): Payer: Medicare Other | Admitting: Vascular Surgery

## 2020-08-28 ENCOUNTER — Encounter: Payer: Self-pay | Admitting: Vascular Surgery

## 2020-08-28 ENCOUNTER — Other Ambulatory Visit: Payer: Self-pay

## 2020-08-28 VITALS — BP 129/76 | HR 64 | Temp 98.6°F | Resp 20 | Ht 68.0 in | Wt 143.5 lb

## 2020-08-28 DIAGNOSIS — I6521 Occlusion and stenosis of right carotid artery: Secondary | ICD-10-CM | POA: Insufficient documentation

## 2020-08-28 DIAGNOSIS — N6320 Unspecified lump in the left breast, unspecified quadrant: Secondary | ICD-10-CM | POA: Diagnosis not present

## 2020-08-28 NOTE — Progress Notes (Signed)
REASON FOR VISIT:   Mild right carotid stenosis  MEDICAL ISSUES:   MILD RIGHT CAROTID STENOSIS: This patient had a 40 to 59% right carotid stenosis back in August 2019.  She comes in for routine follow-up visit in the stenosis has improved and is now less than 39%.  She is asymptomatic.  She is not a smoker.  Given the essentially normal duplex scan today I think we can stretch her follow-up after 3 years.  I ordered a follow-up carotid duplex scan in 3 years and I will see her back at that time.  She is on a statin.  She has a lot of problems with bruising and would prefer to stop taking aspirin.  Given her essentially normal duplex scan today I think that is reasonable.  We have reviewed the potential symptoms of cerebrovascular disease and she will call if she develops any such symptoms.  HPI:   Desiree Byrd is a pleasant 69 y.o. female who I saw in consultation on 07/04/2018 with a 40 to 59% asymptomatic right carotid stenosis.  I recommended a follow-up visit in 1 year.  I do not see that follow-up visit in 1 year or a follow-up carotid duplex scans.  She states that she missed her follow-up appointments because of the Covid situation which is perfectly understandable.  She is now 2 years since her previous study.  She is right-handed.  She denies any history of stroke, TIAs, expressive or receptive aphasia, or amaurosis fugax.  There have been no significant changes in her medical history.  She is on aspirin and is on a statin.  Past Medical History:  Diagnosis Date   Arthritis    Carotid artery occlusion    Fasting hyperglycemia    Lung collapse    x2    Family History  Problem Relation Age of Onset   Colon polyps Mother    Hypertension Mother    Breast cancer Mother        in 87s   Lung cancer Mother    Heart disease Mother    Multiple sclerosis Father        Deceased   Stroke Neg Hx    Colon cancer Neg Hx     SOCIAL HISTORY: Social History   Tobacco  Use   Smoking status: Never Smoker   Smokeless tobacco: Never Used  Substance Use Topics   Alcohol use: Yes    Comment:  socially    No Known Allergies  Current Outpatient Medications  Medication Sig Dispense Refill   aspirin 81 MG tablet Bayer Aspirin     celecoxib (CELEBREX) 200 MG capsule TK 1 C PO QD WF  1   Lysine 1000 MG TABS Take 1 tablet by mouth daily.     naproxen sodium (ALEVE) 220 MG tablet Take 220 mg by mouth 2 (two) times daily as needed.     rosuvastatin (CRESTOR) 5 MG tablet rosuvastatin 5 mg tablet   1 tablet every other day by oral route.     No current facility-administered medications for this visit.    REVIEW OF SYSTEMS:  [X]  denotes positive finding, [ ]  denotes negative finding Cardiac  Comments:  Chest pain or chest pressure:    Shortness of breath upon exertion:    Short of breath when lying flat:    Irregular heart rhythm:        Vascular    Pain in calf, thigh, or hip brought on by ambulation:  Pain in feet at night that wakes you up from your sleep:     Blood clot in your veins:    Leg swelling:         Pulmonary    Oxygen at home:    Productive cough:     Wheezing:         Neurologic    Sudden weakness in arms or legs:     Sudden numbness in arms or legs:     Sudden onset of difficulty speaking or slurred speech:    Temporary loss of vision in one eye:     Problems with dizziness:         Gastrointestinal    Blood in stool:     Vomited blood:         Genitourinary    Burning when urinating:     Blood in urine:        Psychiatric    Major depression:         Hematologic    Bleeding problems:    Problems with blood clotting too easily:        Skin    Rashes or ulcers:        Constitutional    Fever or chills:     PHYSICAL EXAM:   Vitals:   08/28/20 1439 08/28/20 1441  BP: (!) 146/71 129/76  Pulse: 64   Resp: 20   Temp: 98.6 F (37 C)   SpO2: 98%   Weight: 143 lb 8 oz (65.1 kg)   Height: 5\' 8"  (1.727  m)     GENERAL: The patient is a well-nourished female, in no acute distress. The vital signs are documented above. CARDIAC: There is a regular rate and rhythm.  VASCULAR: I do not detect carotid bruits. He has palpable popliteal and pedal pulses bilaterally. She has no significant lower extremity swelling. PULMONARY: There is good air exchange bilaterally without wheezing or rales. ABDOMEN: Soft and non-tender with normal pitched bowel sounds.  MUSCULOSKELETAL: There are no major deformities or cyanosis. NEUROLOGIC: No focal weakness or paresthesias are detected. SKIN: There are no ulcers or rashes noted. PSYCHIATRIC: The patient has a normal affect.  DATA:    CAROTID DUPLEX: I have independently interpreted her carotid duplex scan today.  On the right side there is a less than 39% stenosis.  The right vertebral artery is patent with antegrade flow.  On the left side there is a less than 39% stenosis.  The left vertebral artery is patent with antegrade flow.  Desiree Byrd [facility] [phone]

## 2020-09-25 ENCOUNTER — Ambulatory Visit: Payer: Medicare Other

## 2020-10-01 DIAGNOSIS — K4020 Bilateral inguinal hernia, without obstruction or gangrene, not specified as recurrent: Secondary | ICD-10-CM | POA: Diagnosis not present

## 2020-10-07 ENCOUNTER — Other Ambulatory Visit: Payer: Medicare Other

## 2020-10-07 ENCOUNTER — Telehealth: Payer: Self-pay | Admitting: Unknown Physician Specialty

## 2020-10-07 ENCOUNTER — Other Ambulatory Visit: Payer: Self-pay

## 2020-10-07 ENCOUNTER — Other Ambulatory Visit: Payer: Self-pay | Admitting: Unknown Physician Specialty

## 2020-10-07 ENCOUNTER — Telehealth (HOSPITAL_COMMUNITY): Payer: Self-pay

## 2020-10-07 ENCOUNTER — Encounter: Payer: Self-pay | Admitting: Unknown Physician Specialty

## 2020-10-07 DIAGNOSIS — Z1159 Encounter for screening for other viral diseases: Secondary | ICD-10-CM | POA: Diagnosis not present

## 2020-10-07 DIAGNOSIS — J930 Spontaneous tension pneumothorax: Secondary | ICD-10-CM

## 2020-10-07 DIAGNOSIS — Z20822 Contact with and (suspected) exposure to covid-19: Secondary | ICD-10-CM

## 2020-10-07 DIAGNOSIS — U071 COVID-19: Secondary | ICD-10-CM

## 2020-10-07 NOTE — Telephone Encounter (Signed)
Called to discuss with patient about Covid symptoms and the use of the monoclonal antibody infusion for those with mild to moderate Covid symptoms and at a high risk of hospitalization.     Pt appears to qualify for this infusion due to co-morbid conditions and/or a member of an at-risk group in accordance with the FDA Emergency Use Authorization.    Risk factors include: Age over 25 and hx of spontaneous pneumothorax   Symptom onset: 11/21   Tested positive for COVID 19: 11/21 via at home test. 11/22 tested PCR at [facility].    Discussed information regarding costs of monoclonal antibody treatment, given both CPT & REV codes, and encouraged patient to call their health insurance company to verify cost of treatment that pt will be financially responsible for.  Patient aware they will receive a call from APP for further information and to schedule appointment. All questions answered.

## 2020-10-07 NOTE — Telephone Encounter (Signed)
I connected by phone with Celene Squibb on 10/07/2020 at 4:22 PM to discuss the potential use of a new treatment for mild to moderate COVID-19 viral infection in non-hospitalized patients.  This patient is a 69 y.o. female that meets the FDA criteria for Emergency Use Authorization of COVID monoclonal antibody casirivimab/imdevimab, bamlanivimab/eteseviamb, or sotrovimab.  Has a (+) direct SARS-CoV-2 viral test result  Has mild or moderate COVID-19   Is NOT hospitalized due to COVID-19  Is within 10 days of symptom onset  Has at least one of the high risk factor(s) for progression to severe COVID-19 and/or hospitalization as defined in EUA.  Specific high risk criteria : Older age (>/= 70 yo) and Cardiovascular disease or hypertension   I have spoken and communicated the following to the patient or parent/caregiver regarding COVID monoclonal antibody treatment:  1. FDA has authorized the emergency use for the treatment of mild to moderate COVID-19 in adults and pediatric patients with positive results of direct SARS-CoV-2 viral testing who are 17 years of age and older weighing at least 40 kg, and who are at high risk for progressing to severe COVID-19 and/or hospitalization.  2. The significant known and potential risks and benefits of COVID monoclonal antibody, and the extent to which such potential risks and benefits are unknown.  3. Information on available alternative treatments and the risks and benefits of those alternatives, including clinical trials.  4. Patients treated with COVID monoclonal antibody should continue to self-isolate and use infection control measures (e.g., wear mask, isolate, social distance, avoid sharing personal items, clean and disinfect high touch surfaces, and frequent handwashing) according to CDC guidelines.   5. The patient or parent/caregiver has the option to accept or refuse COVID monoclonal antibody treatment.  After reviewing this information with  the patient, the patient has agreed to receive one of the available covid 19 monoclonal antibodies and will be provided an appropriate fact sheet prior to infusion. Kathrine Haddock, NP 10/07/2020 4:22 PM  Sx onset 11/20

## 2020-10-08 ENCOUNTER — Ambulatory Visit (HOSPITAL_COMMUNITY)
Admission: RE | Admit: 2020-10-08 | Discharge: 2020-10-08 | Disposition: A | Discharge status: Home / Self Care | Payer: Medicare Other | Source: Ambulatory Visit | Attending: Pulmonary Disease | Admitting: Pulmonary Disease

## 2020-10-08 ENCOUNTER — Other Ambulatory Visit (HOSPITAL_COMMUNITY): Payer: Self-pay

## 2020-10-08 DIAGNOSIS — J930 Spontaneous tension pneumothorax: Secondary | ICD-10-CM | POA: Diagnosis not present

## 2020-10-08 DIAGNOSIS — U071 COVID-19: Secondary | ICD-10-CM | POA: Diagnosis not present

## 2020-10-08 DIAGNOSIS — Z23 Encounter for immunization: Secondary | ICD-10-CM | POA: Insufficient documentation

## 2020-10-08 MED ORDER — METHYLPREDNISOLONE SODIUM SUCC 125 MG IJ SOLR: 125.0000 mg | Freq: Once | INTRAMUSCULAR | Status: DC | PRN

## 2020-10-08 MED ORDER — FAMOTIDINE IN NACL 20-0.9 MG/50ML-% IV SOLN: 20.0000 mg | Freq: Once | INTRAVENOUS | Status: DC | PRN

## 2020-10-08 MED ORDER — SODIUM CHLORIDE 0.9 % IV SOLN: INTRAVENOUS | Status: DC | PRN

## 2020-10-08 MED ORDER — ALBUTEROL SULFATE HFA 108 (90 BASE) MCG/ACT IN AERS: 2.0000 | INHALATION_SPRAY | Freq: Once | RESPIRATORY_TRACT | Status: DC | PRN

## 2020-10-08 MED ORDER — EPINEPHRINE 0.3 MG/0.3ML IJ SOAJ: 0.3000 mg | Freq: Once | INTRAMUSCULAR | Status: DC | PRN

## 2020-10-08 MED ORDER — DIPHENHYDRAMINE HCL 50 MG/ML IJ SOLN: 50.0000 mg | Freq: Once | INTRAMUSCULAR | Status: DC | PRN

## 2020-10-08 MED ORDER — SOTROVIMAB 500 MG/8ML IV SOLN
500.0000 mg | Freq: Once | INTRAVENOUS | Status: AC
  Administered 2020-10-08: 500 mg via INTRAVENOUS

## 2020-10-08 NOTE — Progress Notes (Signed)
Diagnosis: COVID-19  Physician: Dr. Patrick Wright  Procedure: Covid Infusion Clinic Med: Sotrovimab infusion - Provided patient with sotrovimab fact sheet for patients, parents, and caregivers prior to infusion.   Complications: No immediate complications noted  Discharge: Discharged home    

## 2020-10-08 NOTE — Progress Notes (Signed)
Patient reviewed Fact Sheet for Patients, Parents, and Caregivers for Emergency Use Authorization (EUA) of Sotrovimab for the Treatment of Coronavirus. Patient also reviewed and is agreeable to the estimated cost of treatment. Patient is agreeable to proceed.   

## 2020-10-08 NOTE — Discharge Instructions (Signed)
10 Things You Can Do to Manage Your COVID-19 Symptoms at Home If you have possible or confirmed COVID-19: 1. Stay home from work and school. And stay away from other public places. If you must go out, avoid using any kind of public transportation, ridesharing, or taxis. 2. Monitor your symptoms carefully. If your symptoms get worse, call your healthcare provider immediately. 3. Get rest and stay hydrated. 4. If you have a medical appointment, call the healthcare provider ahead of time and tell them that you have or may have COVID-19. 5. For medical emergencies, call 911 and notify the dispatch personnel that you have or may have COVID-19. 6. Cover your cough and sneezes with a tissue or use the inside of your elbow. 7. Wash your hands often with soap and water for at least 20 seconds or clean your hands with an alcohol-based hand sanitizer that contains at least 60% alcohol. 8. As much as possible, stay in a specific room and away from other people in your home. Also, you should use a separate bathroom, if available. If you need to be around other people in or outside of the home, wear a mask. 9. Avoid sharing personal items with other people in your household, like dishes, towels, and bedding. 10. Clean all surfaces that are touched often, like counters, tabletops, and doorknobs. Use household cleaning sprays or wipes according to the label instructions. [email or web address] 05/17/2019 This information is not intended to replace advice given to you by your health care provider. Make sure you discuss any questions you have with your health care provider. Document Revised: 10/19/2019 Document Reviewed: 10/19/2019 Elsevier Patient Education  [employer]. If you have any questions or concerns after the infusion please call the Advanced Practice Provider on call at [phone]

## 2020-10-09 LAB — NOVEL CORONAVIRUS, NAA: SARS-CoV-2, NAA: DETECTED — AB

## 2020-10-09 LAB — SARS-COV-2, NAA 2 DAY TAT

## 2020-10-18 ENCOUNTER — Ambulatory Visit: Payer: Self-pay | Admitting: Surgery

## 2020-10-18 DIAGNOSIS — K4090 Unilateral inguinal hernia, without obstruction or gangrene, not specified as recurrent: Secondary | ICD-10-CM | POA: Diagnosis not present

## 2020-10-18 NOTE — H&P (Signed)
Desiree Byrd Appointment: 10/18/2020 9:20 AM Location: [facility] Patient #: [id number] DOB: 1951/04/07 Married / Language: Desiree Byrd / Race: White Female  History of Present Illness Desiree Moores A. Terriann Difonzo MD; 10/18/2020 10:37 AM) Patient words: Patient presents for evaluation of a right groin bulge and possible left groin bulge. She was noted to have this bulge last few months. She has minimal pain. She is active playing tennis and pickle ball. The bulge is sore to push on it she states but is soft otherwise. She's think she may have a second bulge in her left groin. She has no nausea or vomiting. No evidence or bowel or bladder dysfunction.  The patient is a 69 year old female.   Past Surgical History Desiree Byrd, Desiree Byrd; 10/18/2020 9:08 AM) Knee Surgery Right. Shoulder Surgery Right. Thyroid Surgery  Diagnostic Studies History Desiree Byrd, Desiree Byrd; 10/18/2020 9:08 AM) Mammogram within last year Pap Smear 1-5 years ago  Allergies Desiree Byrd, Byrd; 10/18/2020 9:09 AM) No Known Drug Allergies [10/18/2020]: Allergies Reconciled  Medication History Desiree Byrd, Desiree Byrd; 10/18/2020 9:10 AM) Celecoxib (200MG  Capsule, Oral) Active. Rosuvastatin Calcium (5MG  Cap Sprinkle, Oral) Active. Aleve (220MG  Tablet, Oral) Active. Lysine (1000MG  Tablet, Oral) Active. Medications Reconciled  Social History Desiree Byrd, Desiree Byrd; 10/18/2020 9:08 AM) Alcohol use Occasional alcohol use. Caffeine use Coffee. No drug use  Family History Desiree Byrd, Desiree Byrd; 10/18/2020 9:08 AM) Breast Cancer Mother. Respiratory Condition Brother.  Pregnancy / Birth History Desiree Byrd, Desiree Byrd; 10/18/2020 9:08 AM) Age at menarche 58 years. Age of menopause 51-55 Contraceptive History Oral contraceptives. Gravida 3 Maternal age 54-35 Para 2     Review of Systems Desiree Byrd Byrd; 10/18/2020 9:08 AM) General Not Present- Appetite Loss, Chills, Fatigue, Fever, Night Sweats, Weight  Gain and Weight Loss. Female Genitourinary Not Present- Frequency, Nocturia, Painful Urination, Pelvic Pain and Urgency. Endocrine Not Present- Cold Intolerance, Excessive Hunger, Hair Changes, Heat Intolerance, Hot flashes and New Diabetes.  Vitals Desiree Byrd; 10/18/2020 9:11 AM) 10/18/2020 9:11 AM Weight: 142.13 lb Height: 68in Body Surface Area: 1.77 m Body Mass Index: 21.61 kg/m  Temp.: 97.34F  Pulse: 58 (Regular)  P.OX: 99% (Room air) BP: 120/80(Sitting, Left Arm, Standard)        Physical Exam (Desiree Narula A. Kirill Chatterjee MD; 10/18/2020 10:38 AM)  General Mental Status-Alert. General Appearance-Consistent with stated age. Hydration-Well hydrated. Voice-Normal.  Head and Neck Head-normocephalic, atraumatic with no lesions or palpable masses. Trachea-midline. Thyroid Gland Characteristics - normal size and consistency.  Eye Eyeball - Bilateral-Extraocular movements intact. Sclera/Conjunctiva - Bilateral-No scleral icterus.  Chest and Lung Exam Chest and lung exam reveals -quiet, even and easy respiratory effort with no use of accessory muscles and on auscultation, normal breath sounds, no adventitious sounds and normal vocal resonance. Inspection Chest Wall - Normal. Back - normal.  Breast Breast - Left-Symmetric, Non Tender, No Biopsy scars, no Dimpling - Left, No Inflammation, No Lumpectomy scars, No Mastectomy scars, No Peau d' Orange. Breast - Right-Symmetric, Non Tender, No Biopsy scars, no Dimpling - Right, No Inflammation, No Lumpectomy scars, No Mastectomy scars, No Peau d' Orange. Breast Lump-No Palpable Breast Mass.  Cardiovascular Cardiovascular examination reveals -normal heart sounds, regular rate and rhythm with no murmurs and normal pedal pulses bilaterally.  Abdomen Inspection Skin - Scar - no surgical scars. Hernias - Inguinal hernia - Right - Reducible - Right. Palpation/Percussion Palpation and Percussion of  the abdomen reveal - Soft, Non Tender, No Rebound tenderness, No Rigidity (guarding) and No hepatosplenomegaly. Auscultation Auscultation of the abdomen  reveals - Bowel sounds normal.  Neurologic Neurologic evaluation reveals -alert and oriented x 3 with no impairment of recent or remote memory. Mental Status-Normal.  Musculoskeletal Normal Exam - Left-Upper Extremity Strength Normal and Lower Extremity Strength Normal. Normal Exam - Right-Upper Extremity Strength Normal and Lower Extremity Strength Normal.  Lymphatic Head & Neck  General Head & Neck Lymphatics: Bilateral - Description - Normal. Axillary  General Axillary Region: Bilateral - Description - Normal. Tenderness - Non Tender. Femoral & Inguinal  Generalized Femoral & Inguinal Lymphatics: Left - Size - 1.0 cm. Consistency - Soft. Mobility - Mobile. Bilateral - Description - Normal. Tenderness - Non Tender.    Assessment & Plan (Desiree Troutman A. Ceciley Buist MD; 10/18/2020 10:39 AM)  RIGHT INGUINAL HERNIA (K40.90) Impression: Reducible. No evidence of left humeral hernia today on examination. She does have some small inguinal lymph nodes on the left. Even with Valsalva could not reproduce a left inguinal hernia. Recommend repair of her right inguinal hernia with mesh. Discussed laparoscopic and open techniques. She has opted for open repair of her right inguinal hernia. The risk of hernia repair include bleeding, infection, organ injury, bowel injury, bladder injury, nerve injury recurrent hernia, blood clots, worsening of underlying condition, chronic pain, mesh use, open surgery, death, and the need for other operations. Pt agrees to proceed   Total time 30 minutes  Current Plans Pt Education - [facility] Mesh education: discussed with patient and provided information. The anatomy & physiology of the abdominal wall and pelvic floor was discussed. The pathophysiology of hernias in the inguinal and pelvic region was discussed.  Natural history risks such as progressive enlargement, pain, incarceration, and strangulation was discussed. Contributors to complications such as smoking, obesity, diabetes, prior surgery, etc were discussed.  I feel the risks of no intervention will lead to serious problems that outweigh the operative risks; therefore, I recommended surgery to reduce and repair the hernia. I explained an open approach. I noted usual use of mesh to patch and/or buttress hernia repair  Risks such as bleeding, infection, abscess, need for further treatment, heart attack, death, and other risks were discussed. I noted a good likelihood this will help address the problem. Goals of post-operative recovery were discussed as well. Possibility that this will not correct all symptoms was explained. I stressed the importance of low-impact activity, aggressive pain control, avoiding constipation, & not pushing through pain to minimize risk of post-operative chronic pain or injury. Possibility of reherniation was discussed. We will work to minimize complications.  An educational handout further explaining the pathology & treatment options was given as well. Questions were answered. The patient expresses understanding & wishes to proceed with surgery.  You are being scheduled for surgery- Our schedulers will call you.  You should hear from our office's scheduling department within 5 working days about the location, date, and time of surgery. We try to make accommodations for patient's preferences in scheduling surgery, but sometimes the OR schedule or the surgeon's schedule prevents [place] from making those accommodations.  If you have not heard from our office [phone]) in 5 working days, call the office and ask for your surgeon's nurse.  If you have other questions about your diagnosis, plan, or surgery, call the office and ask for your surgeon's nurse.  Pt Education - Pamphlet Given - Hernia Surgery:  discussed with patient and provided information.

## 2020-10-30 DIAGNOSIS — L570 Actinic keratosis: Secondary | ICD-10-CM | POA: Diagnosis not present

## 2020-10-30 DIAGNOSIS — Z85828 Personal history of other malignant neoplasm of skin: Secondary | ICD-10-CM | POA: Diagnosis not present

## 2020-12-05 ENCOUNTER — Encounter (HOSPITAL_BASED_OUTPATIENT_CLINIC_OR_DEPARTMENT_OTHER): Payer: Self-pay | Admitting: Surgery

## 2020-12-06 ENCOUNTER — Encounter (HOSPITAL_BASED_OUTPATIENT_CLINIC_OR_DEPARTMENT_OTHER)
Admission: RE | Admit: 2020-12-06 | Discharge: 2020-12-06 | Disposition: A | Discharge status: Home / Self Care | Payer: Medicare Other | Source: Ambulatory Visit | Attending: Surgery | Admitting: Surgery

## 2020-12-06 DIAGNOSIS — Z01812 Encounter for preprocedural laboratory examination: Secondary | ICD-10-CM | POA: Diagnosis not present

## 2020-12-06 LAB — CBC WITH DIFFERENTIAL/PLATELET
Abs Immature Granulocytes: 0 10*3/uL (ref 0.00–0.07)
Basophils Absolute: 0 10*3/uL (ref 0.0–0.1)
Basophils Relative: 1 %
Eosinophils Absolute: 0.1 10*3/uL (ref 0.0–0.5)
Eosinophils Relative: 2 %
HCT: 39.7 % (ref 36.0–46.0)
Hemoglobin: 13.2 g/dL (ref 12.0–15.0)
Immature Granulocytes: 0 %
Lymphocytes Relative: 30 %
Lymphs Abs: 1.6 10*3/uL (ref 0.7–4.0)
MCH: 30.3 pg (ref 26.0–34.0)
MCHC: 33.2 g/dL (ref 30.0–36.0)
MCV: 91.1 fL (ref 80.0–100.0)
Monocytes Absolute: 0.4 10*3/uL (ref 0.1–1.0)
Monocytes Relative: 8 %
Neutro Abs: 3.1 10*3/uL (ref 1.7–7.7)
Neutrophils Relative %: 59 %
Platelets: 186 10*3/uL (ref 150–400)
RBC: 4.36 MIL/uL (ref 3.87–5.11)
RDW: 12.5 % (ref 11.5–15.5)
WBC: 5.3 10*3/uL (ref 4.0–10.5)
nRBC: 0 % (ref 0.0–0.2)

## 2020-12-06 LAB — COMPREHENSIVE METABOLIC PANEL
ALT: 23 U/L (ref 0–44)
AST: 27 U/L (ref 15–41)
Albumin: 4.2 g/dL (ref 3.5–5.0)
Alkaline Phosphatase: 51 U/L (ref 38–126)
Anion gap: 8 (ref 5–15)
BUN: 17 mg/dL (ref 8–23)
CO2: 28 mmol/L (ref 22–32)
Calcium: 9.6 mg/dL (ref 8.9–10.3)
Chloride: 103 mmol/L (ref 98–111)
Creatinine, Ser: 0.64 mg/dL (ref 0.44–1.00)
GFR, Estimated: >60 mL/min (ref >60)
Glucose, Bld: 101 mg/dL — ABNORMAL HIGH (ref 70–99)
Potassium: 4.7 mmol/L (ref 3.5–5.1)
Sodium: 139 mmol/L (ref 135–145)
Total Bilirubin: 1 mg/dL (ref 0.3–1.2)
Total Protein: 6.6 g/dL (ref 6.5–8.1)

## 2020-12-06 NOTE — Progress Notes (Signed)

## 2020-12-09 ENCOUNTER — Other Ambulatory Visit (HOSPITAL_COMMUNITY): Payer: Medicare Other

## 2020-12-12 ENCOUNTER — Encounter (HOSPITAL_BASED_OUTPATIENT_CLINIC_OR_DEPARTMENT_OTHER)
Admission: RE | Disposition: A | Discharge status: Home / Self Care | Payer: Self-pay | Source: Home / Self Care | Attending: Surgery

## 2020-12-12 ENCOUNTER — Ambulatory Visit (HOSPITAL_BASED_OUTPATIENT_CLINIC_OR_DEPARTMENT_OTHER): Payer: Medicare Other | Admitting: Anesthesiology

## 2020-12-12 ENCOUNTER — Other Ambulatory Visit: Payer: Self-pay

## 2020-12-12 ENCOUNTER — Encounter (HOSPITAL_BASED_OUTPATIENT_CLINIC_OR_DEPARTMENT_OTHER): Payer: Self-pay | Admitting: Surgery

## 2020-12-12 ENCOUNTER — Ambulatory Visit (HOSPITAL_BASED_OUTPATIENT_CLINIC_OR_DEPARTMENT_OTHER)
Admission: RE | Admit: 2020-12-12 | Discharge: 2020-12-12 | Disposition: A | Discharge status: Home / Self Care | Payer: Medicare Other | Attending: Surgery | Admitting: Surgery

## 2020-12-12 DIAGNOSIS — Z791 Long term (current) use of non-steroidal anti-inflammatories (NSAID): Secondary | ICD-10-CM | POA: Insufficient documentation

## 2020-12-12 DIAGNOSIS — K4130 Unilateral femoral hernia, with obstruction, without gangrene, not specified as recurrent: Secondary | ICD-10-CM | POA: Insufficient documentation

## 2020-12-12 DIAGNOSIS — Z803 Family history of malignant neoplasm of breast: Secondary | ICD-10-CM | POA: Insufficient documentation

## 2020-12-12 DIAGNOSIS — K4190 Unilateral femoral hernia, without obstruction or gangrene, not specified as recurrent: Secondary | ICD-10-CM | POA: Diagnosis not present

## 2020-12-12 DIAGNOSIS — K4090 Unilateral inguinal hernia, without obstruction or gangrene, not specified as recurrent: Secondary | ICD-10-CM | POA: Diagnosis not present

## 2020-12-12 DIAGNOSIS — G8918 Other acute postprocedural pain: Secondary | ICD-10-CM | POA: Diagnosis not present

## 2020-12-12 DIAGNOSIS — Z79899 Other long term (current) drug therapy: Secondary | ICD-10-CM | POA: Insufficient documentation

## 2020-12-12 DIAGNOSIS — M7051 Other bursitis of knee, right knee: Secondary | ICD-10-CM | POA: Diagnosis not present

## 2020-12-12 HISTORY — PX: INSERTION OF MESH: SHX5868

## 2020-12-12 HISTORY — DX: Nausea with vomiting, unspecified: R11.2

## 2020-12-12 HISTORY — PX: INGUINAL HERNIA REPAIR: SHX194

## 2020-12-12 HISTORY — DX: Other specified postprocedural states: Z98.890

## 2020-12-12 SURGERY — REPAIR, HERNIA, INGUINAL, ADULT
Anesthesia: General | Site: Groin | Laterality: Right

## 2020-12-12 MED ORDER — GABAPENTIN 300 MG PO CAPS
ORAL_CAPSULE | ORAL | Status: AC
  Filled 2020-12-12: qty 1

## 2020-12-12 MED ORDER — BUPIVACAINE HCL 0.25 % IJ SOLN
INTRAMUSCULAR | Status: DC | PRN
  Administered 2020-12-12: 7 mL

## 2020-12-12 MED ORDER — OXYCODONE HCL 5 MG PO TABS
5.0000 mg | ORAL_TABLET | Freq: Once | ORAL | Status: DC | PRN
Start: 2020-12-12 — End: 2020-12-12

## 2020-12-12 MED ORDER — ROPIVACAINE HCL 5 MG/ML IJ SOLN
INTRAMUSCULAR | Status: DC | PRN
  Administered 2020-12-12: 30 mL via PERINEURAL

## 2020-12-12 MED ORDER — 0.9 % SODIUM CHLORIDE (POUR BTL) OPTIME
TOPICAL | Status: DC | PRN
  Administered 2020-12-12: 1000 mL

## 2020-12-12 MED ORDER — ONDANSETRON HCL 4 MG/2ML IJ SOLN
INTRAMUSCULAR | Status: AC
  Filled 2020-12-12: qty 2

## 2020-12-12 MED ORDER — CEFAZOLIN SODIUM-DEXTROSE 2-4 GM/100ML-% IV SOLN
INTRAVENOUS | Status: AC
  Filled 2020-12-12: qty 100

## 2020-12-12 MED ORDER — ACETAMINOPHEN 500 MG PO TABS
1000.0000 mg | ORAL_TABLET | ORAL | Status: AC
  Administered 2020-12-12: 1000 mg via ORAL

## 2020-12-12 MED ORDER — MIDAZOLAM HCL 2 MG/2ML IJ SOLN
INTRAMUSCULAR | Status: AC
  Filled 2020-12-12: qty 2

## 2020-12-12 MED ORDER — CEFAZOLIN SODIUM-DEXTROSE 2-4 GM/100ML-% IV SOLN
2.0000 g | INTRAVENOUS | Status: AC
  Administered 2020-12-12: 2 g via INTRAVENOUS

## 2020-12-12 MED ORDER — HYDROCODONE-ACETAMINOPHEN 5-325 MG PO TABS: 1.0000 | ORAL_TABLET | Freq: Four times a day (QID) | ORAL | 0 refills | Status: DC | PRN

## 2020-12-12 MED ORDER — HYDROMORPHONE HCL 1 MG/ML IJ SOLN: 0.2500 mg | INTRAMUSCULAR | Status: DC | PRN

## 2020-12-12 MED ORDER — CHLORHEXIDINE GLUCONATE CLOTH 2 % EX PADS: 6.0000 | MEDICATED_PAD | Freq: Once | CUTANEOUS | Status: DC

## 2020-12-12 MED ORDER — FENTANYL CITRATE (PF) 100 MCG/2ML IJ SOLN
INTRAMUSCULAR | Status: AC
  Filled 2020-12-12: qty 2

## 2020-12-12 MED ORDER — HEMOSTATIC AGENTS (NO CHARGE) OPTIME
TOPICAL | Status: DC | PRN
  Administered 2020-12-12: 1 via TOPICAL

## 2020-12-12 MED ORDER — MIDAZOLAM HCL 2 MG/2ML IJ SOLN
2.0000 mg | Freq: Once | INTRAMUSCULAR | Status: AC
  Administered 2020-12-12: 2 mg via INTRAVENOUS

## 2020-12-12 MED ORDER — LIDOCAINE HCL (CARDIAC) PF 100 MG/5ML IV SOSY
PREFILLED_SYRINGE | INTRAVENOUS | Status: DC | PRN
  Administered 2020-12-12: 40 mg via INTRAVENOUS

## 2020-12-12 MED ORDER — FENTANYL CITRATE (PF) 100 MCG/2ML IJ SOLN
50.0000 ug | Freq: Once | INTRAMUSCULAR | Status: AC
  Administered 2020-12-12: 50 ug via INTRAVENOUS

## 2020-12-12 MED ORDER — GABAPENTIN 300 MG PO CAPS
300.0000 mg | ORAL_CAPSULE | ORAL | Status: AC
  Administered 2020-12-12: 300 mg via ORAL

## 2020-12-12 MED ORDER — PROPOFOL 10 MG/ML IV BOLUS
INTRAVENOUS | Status: DC | PRN
  Administered 2020-12-12: 130 mg via INTRAVENOUS
  Administered 2020-12-12: 10 mg via INTRAVENOUS

## 2020-12-12 MED ORDER — DEXAMETHASONE SODIUM PHOSPHATE 4 MG/ML IJ SOLN
INTRAMUSCULAR | Status: DC | PRN
  Administered 2020-12-12: 4 mg via INTRAVENOUS

## 2020-12-12 MED ORDER — LACTATED RINGERS IV SOLN: INTRAVENOUS | Status: DC

## 2020-12-12 MED ORDER — PROMETHAZINE HCL 25 MG/ML IJ SOLN: 6.2500 mg | INTRAMUSCULAR | Status: DC | PRN

## 2020-12-12 MED ORDER — ONDANSETRON HCL 4 MG/2ML IJ SOLN
INTRAMUSCULAR | Status: DC | PRN
  Administered 2020-12-12: 4 mg via INTRAVENOUS

## 2020-12-12 MED ORDER — DROPERIDOL 2.5 MG/ML IJ SOLN
INTRAMUSCULAR | Status: DC | PRN
  Administered 2020-12-12: .625 mg via INTRAVENOUS

## 2020-12-12 MED ORDER — PHENYLEPHRINE HCL (PRESSORS) 10 MG/ML IV SOLN
INTRAVENOUS | Status: DC | PRN
  Administered 2020-12-12: 80 ug via INTRAVENOUS

## 2020-12-12 MED ORDER — ROCURONIUM BROMIDE 100 MG/10ML IV SOLN
INTRAVENOUS | Status: DC | PRN
  Administered 2020-12-12: 60 mg via INTRAVENOUS
  Administered 2020-12-12: 10 mg via INTRAVENOUS

## 2020-12-12 MED ORDER — EPHEDRINE SULFATE 50 MG/ML IJ SOLN
INTRAMUSCULAR | Status: DC | PRN
  Administered 2020-12-12: 10 mg via INTRAVENOUS
  Administered 2020-12-12 (×3): 5 mg via INTRAVENOUS

## 2020-12-12 MED ORDER — ZOLPIDEM TARTRATE ER 12.5 MG PO TBCR: 12.5000 mg | EXTENDED_RELEASE_TABLET | Freq: Every evening | ORAL | 1 refills | Status: DC | PRN

## 2020-12-12 MED ORDER — SUGAMMADEX SODIUM 200 MG/2ML IV SOLN
INTRAVENOUS | Status: DC | PRN
  Administered 2020-12-12: 200 mg via INTRAVENOUS

## 2020-12-12 MED ORDER — OXYCODONE HCL 5 MG/5ML PO SOLN: 5.0000 mg | Freq: Once | ORAL | Status: DC | PRN

## 2020-12-12 MED ORDER — ACETAMINOPHEN 500 MG PO TABS
ORAL_TABLET | ORAL | Status: AC
  Filled 2020-12-12: qty 2

## 2020-12-12 SURGICAL SUPPLY — 51 items
ADH SKN CLS APL DERMABOND .7 (GAUZE/BANDAGES/DRESSINGS) ×1
APL PRP STRL LF DISP 70% ISPRP (MISCELLANEOUS) ×1
BLADE CLIPPER SURG (BLADE) IMPLANT
BLADE SURG 15 STRL LF DISP TIS (BLADE) ×1 IMPLANT
BLADE SURG 15 STRL SS (BLADE) ×2
CANISTER SUCT 1200ML W/VALVE (MISCELLANEOUS) IMPLANT
CHLORAPREP W/TINT 26 (MISCELLANEOUS) ×2 IMPLANT
COVER BACK TABLE 60X90IN (DRAPES) ×2 IMPLANT
COVER MAYO STAND STRL (DRAPES) ×2 IMPLANT
COVER WAND RF STERILE (DRAPES) IMPLANT
DECANTER SPIKE VIAL GLASS SM (MISCELLANEOUS) IMPLANT
DERMABOND ADVANCED (GAUZE/BANDAGES/DRESSINGS) ×1
DERMABOND ADVANCED .7 DNX12 (GAUZE/BANDAGES/DRESSINGS) ×1 IMPLANT
DRAIN PENROSE 1/2X12 LTX STRL (WOUND CARE) ×2 IMPLANT
DRAPE LAPAROTOMY TRNSV 102X78 (DRAPES) ×2 IMPLANT
DRAPE UTILITY XL STRL (DRAPES) ×2 IMPLANT
ELECT COATED BLADE 2.86 ST (ELECTRODE) ×2 IMPLANT
ELECT REM PT RETURN 9FT ADLT (ELECTROSURGICAL) ×2
ELECTRODE REM PT RTRN 9FT ADLT (ELECTROSURGICAL) ×1 IMPLANT
GAUZE 4X4 16PLY RFD (DISPOSABLE) IMPLANT
GAUZE SPONGE 4X4 12PLY STRL LF (GAUZE/BANDAGES/DRESSINGS) IMPLANT
GLOVE ECLIPSE 8.0 STRL XLNG CF (GLOVE) ×2 IMPLANT
GLOVE SRG 8 PF TXTR STRL LF DI (GLOVE) ×1 IMPLANT
GLOVE SURG UNDER POLY LF SZ8 (GLOVE) ×2
GOWN STRL REUS W/ TWL LRG LVL3 (GOWN DISPOSABLE) ×2 IMPLANT
GOWN STRL REUS W/ TWL XL LVL3 (GOWN DISPOSABLE) ×1 IMPLANT
GOWN STRL REUS W/TWL LRG LVL3 (GOWN DISPOSABLE) ×4
GOWN STRL REUS W/TWL XL LVL3 (GOWN DISPOSABLE) ×2
HEMOSTAT SNOW SURGICEL 2X4 (HEMOSTASIS) ×2 IMPLANT
MESH HERNIA SYS ULTRAPRO LRG (Mesh General) ×2 IMPLANT
NEEDLE HYPO 25X1 1.5 SAFETY (NEEDLE) ×2 IMPLANT
NS IRRIG 1000ML POUR BTL (IV SOLUTION) IMPLANT
PACK BASIN DAY SURGERY FS (CUSTOM PROCEDURE TRAY) ×2 IMPLANT
PENCIL SMOKE EVACUATOR (MISCELLANEOUS) ×2 IMPLANT
SLEEVE SCD COMPRESS KNEE MED (MISCELLANEOUS) ×2 IMPLANT
SPONGE LAP 4X18 RFD (DISPOSABLE) ×4 IMPLANT
STRIP CLOSURE SKIN 1/2X4 (GAUZE/BANDAGES/DRESSINGS) IMPLANT
SUT MON AB 4-0 PC3 18 (SUTURE) ×2 IMPLANT
SUT NOVA 0 T19/GS 22DT (SUTURE) IMPLANT
SUT NOVA NAB DX-16 0-1 5-0 T12 (SUTURE) ×8 IMPLANT
SUT VIC AB 2-0 SH 27 (SUTURE) ×2
SUT VIC AB 2-0 SH 27XBRD (SUTURE) ×1 IMPLANT
SUT VIC AB 3-0 54X BRD REEL (SUTURE) IMPLANT
SUT VIC AB 3-0 BRD 54 (SUTURE)
SUT VICRYL 3-0 CR8 SH (SUTURE) ×2 IMPLANT
SUT VICRYL AB 2 0 TIE (SUTURE) IMPLANT
SUT VICRYL AB 2 0 TIES (SUTURE)
SYR CONTROL 10ML LL (SYRINGE) ×2 IMPLANT
TOWEL GREEN STERILE FF (TOWEL DISPOSABLE) ×2 IMPLANT
TUBE CONNECTING 20X1/4 (TUBING) IMPLANT
YANKAUER SUCT BULB TIP NO VENT (SUCTIONS) IMPLANT

## 2020-12-12 NOTE — Op Note (Signed)
Preoperative diagnosis: Right inguinal hernia  Postoperative diagnosis: Right femoral hernia containing preperitoneal fat  Procedure: Repair of right femoral hernia with mesh  Surgeon: Erroll Luna, MD  Anesthesia: General with right inguinal block and local anesthetic of 0.25% Marcaine plain  EBL: Minimal  Specimen: None  IV fluids: Per anesthesia record  Indications for procedure: The patient is a 70 year old female presented for repair of a right inguinal hernia after evaluation in the office.  This felt reducible.  Risks and benefits of surgery were discussed.  Laparoscopic and open techniques were discussed.  Use of mesh and potential complications of bleeding, infection, blood vessel injury, nerve injury, internal organ injury, need further treatments and/or procedures discussed.  We discussed chronic pain as well as numbness after procedures.  We discussed expected recovery and future activities.  After discussion of the above she agreed to proceed.The risk of hernia repair include bleeding,  Infection,   Recurrence of the hernia,  Mesh use, chronic pain,  Organ injury,  Bowel injury,  Bladder injury,   nerve injury with numbness around the incision,  Death,  and worsening of preexisting  medical problems.  The alternatives to surgery have been discussed as well..  Long term expectations of both operative and non operative treatments have been discussed.   The patient agrees to proceed.  Description of procedure: The patient was met in the holding area and questions were answered.  Right side was marked as the correct site.  She underwent a block of the right inguinal region by anesthesia.  She was then taken back to the operating room.  She was placed supine upon the OR table.  After induction of general anesthesia the right inguinal region was prepped and draped in a sterile fashion and timeout performed.  Proper patient, site and procedure were verified.  Local anesthetic was  infiltrated in the right inguinal crease.  Incision was made in the right inguinal crease.  Dissection was carried through the subcu tissues until we encountered the aponeurosis of the external oblique.  Upon examination there is a large piece of preperitoneal fat that was exiting the femoral canal.  This was a femoral hernia with preperitoneal fat from the preperitoneal space exiting the femoral hernia.  The roof of the inguinal canal was opened with a scalpel Metzenbaum scissors through the external ring.  The round ligaments identified and divided for better visualization.  I was then able to divide the inguinal ligament down to the hernia neck and released the hernia.  Once I did this it easily reduced back into the preperitoneal space.  The right external iliac artery and vein were identified as it exited just below the inguinal ligament.  These were preserved.  I used a large ultra pro hernia system and cut the inner leaflet down by about 2 cm to better facilitate its coverage and fitting into the small femoral hernia defect.  I placed the inner leaflet into the preperitoneal space taking care not to compress the right common iliac vein or artery.  This laid in very nicely.  I then reconstructed the inguinal ligament.  This was done by using interrupted #1 Novafil sutures in a horizontal mattress stitch configuration.  Once I did this I laid the onlay of the mesh onto the floor the inguinal canal.  I secured it to Cooper's ligament as low as I can get it without compressing the vein or artery.  We then secured it to the conjoined tendon medially.  And then I  resecured to the shelving edge of the inguinal ligament laterally.  This was all done with interrupted #1 Novafil pop-off sutures.  The defect appeared to close nicely without any undue tension on the neighboring structures.  Of note the ilioinguinal nerve was divided as well to prevent postoperative entrapment or pain as it coursed along the round  ligament.  The subcutaneous tissues were closed with 3-0 Vicryl.  A stitch was placed to cover the fatty tissues overlying the previous defect with care taken not to injure any underlying structures with 3-0 Vicryl.  The aponeurosis of the external Keenan Bachelor was closed with 2-0 Vicryl.  Additional 3-0 sutures were used to reapproximate the subcutaneous tissues to close as much dead space as possible.  Scarpa's fascia was also reapproximated in similar fashion.  4-0 Monocryl was used to close the skin in a subcuticular fashion.  Dermabond was applied.  All counts were found to be correct.  The patient was awoke extubated taken to recovery in satisfactory condition.

## 2020-12-12 NOTE — Progress Notes (Signed)
Assisted Dr. Sabra Heck with right, ultrasound guided, transabdominal plane block. Side rails up, monitors on throughout procedure. See vital signs in flow sheet. Tolerated Procedure well.

## 2020-12-12 NOTE — Anesthesia Postprocedure Evaluation (Signed)
Anesthesia Post Note  Patient: Desiree Byrd  Procedure(s) Performed: RIGHT INGUINAL HERNIA REPAIR (Right Groin) INSERTION OF MESH (Right Groin)     Patient location during evaluation: PACU Anesthesia Type: General Level of consciousness: awake and alert Pain management: pain level controlled Vital Signs Assessment: post-procedure vital signs reviewed and stable Respiratory status: spontaneous breathing, nonlabored ventilation and respiratory function stable Cardiovascular status: blood pressure returned to baseline and stable Postop Assessment: no apparent nausea or vomiting Anesthetic complications: no   No complications documented.  Last Vitals:  Vitals:   12/12/20 1130 12/12/20 1200  BP: 114/62 127/77  Pulse: 75 73  Resp: 13 20  Temp:  (!) 36.3 C  SpO2: 99% 99%    Last Pain:  Vitals:   12/12/20 1200  TempSrc:   PainSc: 0-No pain                 Lynda Rainwater

## 2020-12-12 NOTE — Interval H&P Note (Signed)
History and Physical Interval Note:  12/12/2020 9:38 AM  Desiree Byrd  has presented today for surgery, with the diagnosis of RIGHT INGUINAL HERNIA.  The various methods of treatment have been discussed with the patient and family. After consideration of risks, benefits and other options for treatment, the patient has consented to  Procedure(s): RIGHT INGUINAL HERNIA REPAIR WITH MESH (Right) as a surgical intervention.  The patient's history has been reviewed, patient examined, no change in status, stable for surgery.  I have reviewed the patient's chart and labs.  Questions were answered to the patient's satisfaction.     [place]

## 2020-12-12 NOTE — Discharge Instructions (Signed)
[facility] _______[facility]  UMBILICAL OR INGUINAL HERNIA REPAIR: POST OP INSTRUCTIONS  Always review your discharge instruction sheet given to you by the facility where your surgery was performed. IF YOU HAVE DISABILITY OR FAMILY LEAVE FORMS, YOU MUST BRING THEM TO THE OFFICE FOR PROCESSING.   DO NOT GIVE THEM TO YOUR DOCTOR.  1. A  prescription for pain medication may be given to you upon discharge.  Take your pain medication as prescribed, if needed.  If narcotic pain medicine is not needed, then you may take acetaminophen (Tylenol) or ibuprofen (Advil) as needed. 2. Take your usually prescribed medications unless otherwise directed. If you need a refill on your pain medication, please contact your pharmacy.  They will contact our office to request authorization. Prescriptions will not be filled after 5 pm or on week-ends. 3. You should follow a light diet the first 24 hours after arrival home, such as soup and crackers, etc.  Be sure to include lots of fluids daily.  Resume your normal diet the day after surgery. 4.Most patients will experience some swelling and bruising around the umbilicus or in the groin and scrotum.  Ice packs and reclining will help.  Swelling and bruising can take several days to resolve.  6. It is common to experience some constipation if taking pain medication after surgery.  Increasing fluid intake and taking a stool softener (such as Colace) will usually help or prevent this problem from occurring.  A mild laxative (Milk of Magnesia or Miralax) should be taken according to package directions if there are no bowel movements after 48 hours. 7. Unless discharge instructions indicate otherwise, you may remove your bandages 24-48 hours after surgery, and you may shower at that time.  You may have steri-strips (small skin tapes) in place directly over the incision.  These strips should be left on the skin for 7-10 days.  If your surgeon used skin glue on the  incision, you may shower in 24 hours.  The glue will flake off over the next 2-3 weeks.  Any sutures or staples will be removed at the office during your follow-up visit. 8. ACTIVITIES:  You may resume regular (light) daily activities beginning the next day--such as daily self-care, walking, climbing stairs--gradually increasing activities as tolerated.  You may have sexual intercourse when it is comfortable.  Refrain from any heavy lifting or straining until approved by your doctor.  a.You may drive when you are no longer taking prescription pain medication, you can comfortably wear a seatbelt, and you can safely maneuver your car and apply brakes. b.RETURN TO WORK:   _____________________________________________  9.You should see your doctor in the office for a follow-up appointment approximately 2-3 weeks after your surgery.  Make sure that you call for this appointment within a day or two after you arrive home to insure a convenient appointment time. 10.OTHER INSTRUCTIONS: _________________________    _____________________________________  WHEN TO CALL YOUR DOCTOR: 1. Fever over 101.0 2. Inability to urinate 3. Nausea and/or vomiting 4. Extreme swelling or bruising 5. Continued bleeding from incision. 6. Increased pain, redness, or drainage from the incision  The clinic staff is available to answer your questions during regular business hours.  Please don't hesitate to call and ask to speak to one of the nurses for clinical concerns.  If you have a medical emergency, go to the nearest emergency room or call 911.  A surgeon from [facility] is always on call at the hospital     [address] [place], [place], [place]  [address] ?  [address], [place], [place]   [phone] ? [phone] ? FAX [phone] Web site: [email or web address]   Post Anesthesia Home Care Instructions  Activity: Get plenty of rest for the remainder of the day. A  responsible individual must stay with you for 24 hours following the procedure.  For the next 24 hours, DO NOT: -Drive a car -Paediatric nurse -Drink alcoholic beverages -Take any medication unless instructed by your physician -Make any legal decisions or sign important papers.  Meals: Start with liquid foods such as gelatin or soup. Progress to regular foods as tolerated. Avoid greasy, spicy, heavy foods. If nausea and/or vomiting occur, drink only clear liquids until the nausea and/or vomiting subsides. Call your physician if vomiting continues.  Special Instructions/Symptoms: Your throat may feel dry or sore from the anesthesia or the breathing tube placed in your throat during surgery. If this causes discomfort, gargle with warm salt water. The discomfort should disappear within 24 hours.  If you had a scopolamine patch placed behind your ear for the management of post- operative nausea and/or vomiting:  1. The medication in the patch is effective for 72 hours, after which it should be removed.  Wrap patch in a tissue and discard in the trash. Wash hands thoroughly with soap and water. 2. You may remove the patch earlier than 72 hours if you experience unpleasant side effects which may include dry mouth, dizziness or visual disturbances. 3. Avoid touching the patch. Wash your hands with soap and water after contact with the patch.    No tylenol until after 3pm if needed today.

## 2020-12-12 NOTE — Transfer of Care (Signed)
Immediate Anesthesia Transfer of Care Note  Patient: Desiree Byrd  Procedure(s) Performed: RIGHT INGUINAL HERNIA REPAIR (Right Groin) INSERTION OF MESH (Right Groin)  Patient Location: PACU  Anesthesia Type:GA combined with regional for post-op pain  Level of Consciousness: awake, alert  and oriented  Airway & Oxygen Therapy: Patient Spontanous Breathing and Patient connected to face mask oxygen  Post-op Assessment: Report given to RN and Post -op Vital signs reviewed and stable  Post vital signs: Reviewed and stable  Last Vitals:  Vitals Value Taken Time  BP 129/73 12/12/20 1120  Temp    Pulse 87 12/12/20 1122  Resp 18 12/12/20 1122  SpO2 100 % 12/12/20 1122  Vitals shown include unvalidated device data.  Last Pain:  Vitals:   12/12/20 0838  TempSrc: Oral  PainSc: 0-No pain      Patients Stated Pain Goal: 4 (97/98/92 1194)  Complications: No complications documented.

## 2020-12-12 NOTE — Anesthesia Procedure Notes (Signed)
Procedure Name: Intubation Date/Time: 12/12/2020 9:57 AM Performed by: Maryella Shivers, CRNA Pre-anesthesia Checklist: Patient identified, Emergency Drugs available, Suction available and Patient being monitored Patient Re-evaluated:Patient Re-evaluated prior to induction Oxygen Delivery Method: Circle system utilized Preoxygenation: Pre-oxygenation with 100% oxygen Induction Type: IV induction Ventilation: Mask ventilation without difficulty Laryngoscope Size: Mac and 3 Grade View: Grade I Tube type: Oral Tube size: 7.0 mm Number of attempts: 1 Airway Equipment and Method: Stylet and Oral airway Placement Confirmation: ETT inserted through vocal cords under direct vision,  positive ETCO2 and breath sounds checked- equal and bilateral Secured at: 20 cm Tube secured with: Tape Dental Injury: Teeth and Oropharynx as per pre-operative assessment

## 2020-12-12 NOTE — Anesthesia Procedure Notes (Signed)
Anesthesia Regional Block: TAP block   Pre-Anesthetic Checklist: ,, timeout performed, Correct Patient, Correct Site, Correct Laterality, Correct Procedure, Correct Position, site marked, Risks and benefits discussed,  Surgical consent,  Pre-op evaluation,  At surgeon's request and post-op pain management  Laterality: Right  Prep: chloraprep       Needles:  Injection technique: Single-shot  Needle Type: Stimiplex     Needle Length: 9cm  Needle Gauge: 21     Additional Needles:   Procedures:,,,, ultrasound used (permanent image in chart),,,,  Narrative:  Start time: 12/12/2020 9:26 AM End time: 12/12/2020 9:31 AM Injection made incrementally with aspirations every 5 mL.  Performed by: Personally  Anesthesiologist: Lynda Rainwater, MD

## 2020-12-12 NOTE — H&P (Signed)
Desiree Byrd  Location: [facility] Patient #: [id number] DOB: 07-04-1951 Married / Language: English / Race: White Female  History of Present Illness ( 10:37 AM) Patient words: Patient presents for evaluation of a right groin bulge and possible left groin bulge. She was noted to have this bulge last few months. She has minimal pain. She is active playing tennis and pickle ball. The bulge is sore to push on it she states but is soft otherwise. She's think she may have a second bulge in her left groin. She has no nausea or vomiting. No evidence or bowel or bladder dysfunction.  The patient is a 70 year old female.   Past Surgical History ( Knee Surgery Right. Shoulder Surgery Right. Thyroid Surgery  Diagnostic Studies History AM) Mammogram within last year Pap Smear 1-5 years ago  Allergies No Known Drug Allergies [10/18/2020]: Allergies Reconciled  Medication History Celecoxib (200MG  Capsule, Oral) Active. Rosuvastatin Calcium (5MG  Cap Sprinkle, Oral) Active. Aleve (220MG  Tablet, Oral) Active. Lysine (1000MG  Tablet, Oral) Active. Medications Reconciled  Social History  Alcohol use Occasional alcohol use. Caffeine use Coffee. No drug use  Family History Breast Cancer Mother. Respiratory Condition Brother.  Pregnancy / Birth History AM) Age at menarche 66 years. Age of menopause 51-55 Contraceptive History Oral contraceptives. Gravida 3 Maternal age 37-35 Para 2     Review of SystemsGeneral Not Present- Appetite Loss, Chills, Fatigue, Fever, Night Sweats, Weight Gain and Weight Loss. Female Genitourinary Not Present- Frequency, Nocturia, Painful Urination, Pelvic Pain and Urgency. Endocrine Not Present- Cold Intolerance, Excessive Hunger, Hair Changes, Heat Intolerance, Hot flashes and New Diabetes.  Vitals   Weight: 142.13 lb Height: 68in Body Surface Area: 1.77 m Body Mass Index: 21.61 kg/m   Temp.: 97.36F  Pulse: 58 (Regular)  P.OX: 99% (Room air) BP: 120/80(Sitting, Left Arm, Standard)        Physical Exam  General Mental Status-Alert. General Appearance-Consistent with stated age. Hydration-Well hydrated. Voice-Normal.  Head and Neck Head-normocephalic, atraumatic with no lesions or palpable masses. Trachea-midline. Thyroid Gland Characteristics - normal size and consistency.  Eye Eyeball - Bilateral-Extraocular movements intact. Sclera/Conjunctiva - Bilateral-No scleral icterus.  Chest and Lung Exam Chest and lung exam reveals -quiet, even and easy respiratory effort with no use of accessory muscles and on auscultation, normal breath sounds, no adventitious sounds and normal vocal resonance. Inspection Chest Wall - Normal. Back - normal.   Cardiovascular Cardiovascular examination reveals -normal heart sounds, regular rate and rhythm with no murmurs and normal pedal pulses bilaterally.  Abdomen Inspection Skin - Scar - no surgical scars. Hernias - Inguinal hernia - Right - Reducible - Right. Palpation/Percussion Palpation and Percussion of the abdomen reveal - Soft, Non Tender, No Rebound tenderness, No Rigidity (guarding) and No hepatosplenomegaly. Auscultation Auscultation of the abdomen reveals - Bowel sounds normal.  Neurologic Neurologic evaluation reveals -alert and oriented x 3 with no impairment of recent or remote memory. Mental Status-Normal.  Musculoskeletal Normal Exam - Left-Upper Extremity Strength Normal and Lower Extremity Strength Normal. Normal Exam - Right-Upper Extremity Strength Normal and Lower Extremity Strength Normal.  Lymphatic Head & Neck  General Head & Neck Lymphatics: Bilateral - Description - Normal. Axillary  General Axillary Region: Bilateral - Description - Normal. Tenderness - Non Tender. Femoral & Inguinal  Generalized Femoral & Inguinal Lymphatics:  Left - Size - 1.0 cm. Consistency - Soft. Mobility - Mobile. Bilateral - Description - Normal. Tenderness - Non Tender.    Assessment & Plan (Jakelyn Squyres A. Turon Kilmer  MD; 10/18/2020 10:39 AM)  RIGHT INGUINAL HERNIA (K40.90) Impression: Reducible. No evidence of left inguinal  hernia today on examination. She does have some small inguinal lymph nodes on the left. Even with Valsalva could not reproduce a left inguinal hernia. Recommend repair of her right inguinal hernia with mesh. Discussed laparoscopic and open techniques. She has opted for open repair of her right inguinal hernia. The risk of hernia repair include bleeding, infection, organ injury, bowel injury, bladder injury, nerve injury recurrent hernia, blood clots, worsening of underlying condition, chronic pain, mesh use, open surgery, death, and the need for other operations. Pt agrees to proceed   Total time 30 minutes  Current Plans Pt Education - [facility] Mesh education: discussed with patient and provided information. The anatomy & physiology of the abdominal wall and pelvic floor was discussed. The pathophysiology of hernias in the inguinal and pelvic region was discussed. Natural history risks such as progressive enlargement, pain, incarceration, and strangulation was discussed. Contributors to complications such as smoking, obesity, diabetes, prior surgery, etc were discussed.  I feel the risks of no intervention will lead to serious problems that outweigh the operative risks; therefore, I recommended surgery to reduce and repair the hernia. I explained an open approach. I noted usual use of mesh to patch and/or buttress hernia repair  Risks such as bleeding, infection, abscess, need for further treatment, heart attack, death, and other risks were discussed. I noted a good likelihood this will help address the problem. Goals of post-operative recovery were discussed as well. Possibility that this will not correct all symptoms  was explained. I stressed the importance of low-impact activity, aggressive pain control, avoiding constipation, & not pushing through pain to minimize risk of post-operative chronic pain or injury. Possibility of reherniation was discussed. We will work to minimize complications.  An educational handout further explaining the pathology & treatment options was given as well. Questions were answered. The patient expresses understanding & wishes to proceed with surgery.  You are being scheduled for surgery- Our schedulers will call you.  You should hear from our office's scheduling department within 5 working days about the location, date, and time of surgery. We try to make accommodations for patient's preferences in scheduling surgery, but sometimes the OR schedule or the surgeon's schedule prevents [place] from making those accommodations.  If you have not heard from our office [phone]) in 5 working days, call the office and ask for your surgeon's nurse.  If you have other questions about your diagnosis, plan, or surgery, call the office and ask for your surgeon's nurse.  Pt Education - Pamphlet Given - Hernia Surgery: discussed with patient and provided information.

## 2020-12-12 NOTE — Anesthesia Preprocedure Evaluation (Signed)
Anesthesia Evaluation  Patient identified by MRN, date of birth, ID band Patient awake    Reviewed: Allergy & Precautions, H&P , NPO status , Patient's Chart, lab work & pertinent test results  History of Anesthesia Complications (+) PONV  Airway Mallampati: II  TM Distance: >3 FB Neck ROM: Full    Dental no notable dental hx.    Pulmonary neg pulmonary ROS,    Pulmonary exam normal breath sounds clear to auscultation       Cardiovascular negative cardio ROS Normal cardiovascular exam Rhythm:Regular Rate:Normal     Neuro/Psych negative neurological ROS  negative psych ROS   GI/Hepatic negative GI ROS, Neg liver ROS,   Endo/Other  negative endocrine ROS  Renal/GU negative Renal ROS  negative genitourinary   Musculoskeletal  (+) Arthritis , Osteoarthritis,    Abdominal   Peds negative pediatric ROS (+)  Hematology negative hematology ROS (+)   Anesthesia Other Findings   Reproductive/Obstetrics negative OB ROS                             Anesthesia Physical Anesthesia Plan  ASA: II  Anesthesia Plan: General   Post-op Pain Management:  Regional for Post-op pain   Induction: Intravenous  PONV Risk Score and Plan: 4 or greater and Ondansetron, Dexamethasone, Midazolam, Droperidol and Treatment may vary due to age or medical condition  Airway Management Planned: LMA and Oral ETT  Additional Equipment:   Intra-op Plan:   Post-operative Plan: Extubation in OR  Informed Consent: I have reviewed the patients History and Physical, chart, labs and discussed the procedure including the risks, benefits and alternatives for the proposed anesthesia with the patient or authorized representative who has indicated his/her understanding and acceptance.     Dental advisory given  Plan Discussed with: CRNA  Anesthesia Plan Comments:         Anesthesia Quick Evaluation

## 2020-12-13 ENCOUNTER — Encounter (HOSPITAL_BASED_OUTPATIENT_CLINIC_OR_DEPARTMENT_OTHER): Payer: Self-pay | Admitting: Surgery

## 2021-01-10 DIAGNOSIS — L570 Actinic keratosis: Secondary | ICD-10-CM | POA: Diagnosis not present

## 2021-01-10 DIAGNOSIS — Z85828 Personal history of other malignant neoplasm of skin: Secondary | ICD-10-CM | POA: Diagnosis not present

## 2021-01-20 DIAGNOSIS — M25562 Pain in left knee: Secondary | ICD-10-CM | POA: Diagnosis not present

## 2021-03-13 DIAGNOSIS — H43813 Vitreous degeneration, bilateral: Secondary | ICD-10-CM | POA: Diagnosis not present

## 2021-03-13 DIAGNOSIS — Z961 Presence of intraocular lens: Secondary | ICD-10-CM | POA: Diagnosis not present

## 2021-03-13 DIAGNOSIS — H52203 Unspecified astigmatism, bilateral: Secondary | ICD-10-CM | POA: Diagnosis not present

## 2021-03-13 DIAGNOSIS — H40053 Ocular hypertension, bilateral: Secondary | ICD-10-CM | POA: Diagnosis not present

## 2021-03-14 DIAGNOSIS — L570 Actinic keratosis: Secondary | ICD-10-CM | POA: Diagnosis not present

## 2021-04-09 ENCOUNTER — Other Ambulatory Visit: Payer: Self-pay | Admitting: Obstetrics and Gynecology

## 2021-04-09 DIAGNOSIS — Z1231 Encounter for screening mammogram for malignant neoplasm of breast: Secondary | ICD-10-CM

## 2021-05-02 ENCOUNTER — Ambulatory Visit: Payer: Medicare Other

## 2021-05-09 ENCOUNTER — Ambulatory Visit
Admission: RE | Admit: 2021-05-09 | Discharge: 2021-05-09 | Disposition: A | Discharge status: Home / Self Care | Payer: Medicare Other | Source: Ambulatory Visit | Attending: Obstetrics and Gynecology | Admitting: Obstetrics and Gynecology

## 2021-05-09 ENCOUNTER — Other Ambulatory Visit: Payer: Self-pay

## 2021-05-09 DIAGNOSIS — Z1231 Encounter for screening mammogram for malignant neoplasm of breast: Secondary | ICD-10-CM | POA: Diagnosis not present

## 2021-05-09 DIAGNOSIS — L570 Actinic keratosis: Secondary | ICD-10-CM | POA: Diagnosis not present

## 2021-05-14 DIAGNOSIS — Z124 Encounter for screening for malignant neoplasm of cervix: Secondary | ICD-10-CM | POA: Diagnosis not present

## 2021-05-14 DIAGNOSIS — Z6823 Body mass index (BMI) 23.0-23.9, adult: Secondary | ICD-10-CM | POA: Diagnosis not present

## 2021-05-14 DIAGNOSIS — E7800 Pure hypercholesterolemia, unspecified: Secondary | ICD-10-CM | POA: Insufficient documentation

## 2021-05-19 DIAGNOSIS — Z20822 Contact with and (suspected) exposure to covid-19: Secondary | ICD-10-CM | POA: Diagnosis not present

## 2021-06-10 DIAGNOSIS — L820 Inflamed seborrheic keratosis: Secondary | ICD-10-CM | POA: Diagnosis not present

## 2021-06-10 DIAGNOSIS — L570 Actinic keratosis: Secondary | ICD-10-CM | POA: Diagnosis not present

## 2021-06-10 DIAGNOSIS — Z85828 Personal history of other malignant neoplasm of skin: Secondary | ICD-10-CM | POA: Diagnosis not present

## 2021-06-12 DIAGNOSIS — H6123 Impacted cerumen, bilateral: Secondary | ICD-10-CM | POA: Diagnosis not present

## 2021-06-25 DIAGNOSIS — M8588 Other specified disorders of bone density and structure, other site: Secondary | ICD-10-CM | POA: Diagnosis not present

## 2021-06-25 DIAGNOSIS — N958 Other specified menopausal and perimenopausal disorders: Secondary | ICD-10-CM | POA: Diagnosis not present

## 2021-08-08 DIAGNOSIS — M25562 Pain in left knee: Secondary | ICD-10-CM | POA: Diagnosis not present

## 2021-08-13 DIAGNOSIS — E785 Hyperlipidemia, unspecified: Secondary | ICD-10-CM | POA: Diagnosis not present

## 2021-08-13 DIAGNOSIS — Z79899 Other long term (current) drug therapy: Secondary | ICD-10-CM | POA: Diagnosis not present

## 2021-08-25 DIAGNOSIS — M1990 Unspecified osteoarthritis, unspecified site: Secondary | ICD-10-CM | POA: Diagnosis not present

## 2021-08-25 DIAGNOSIS — E785 Hyperlipidemia, unspecified: Secondary | ICD-10-CM | POA: Diagnosis not present

## 2021-08-25 DIAGNOSIS — Z1339 Encounter for screening examination for other mental health and behavioral disorders: Secondary | ICD-10-CM | POA: Diagnosis not present

## 2021-08-25 DIAGNOSIS — R82998 Other abnormal findings in urine: Secondary | ICD-10-CM | POA: Diagnosis not present

## 2021-08-25 DIAGNOSIS — I6523 Occlusion and stenosis of bilateral carotid arteries: Secondary | ICD-10-CM | POA: Diagnosis not present

## 2021-08-25 DIAGNOSIS — Z23 Encounter for immunization: Secondary | ICD-10-CM | POA: Diagnosis not present

## 2021-08-25 DIAGNOSIS — Z0000 Encounter for general adult medical examination without abnormal findings: Secondary | ICD-10-CM | POA: Diagnosis not present

## 2021-08-25 DIAGNOSIS — Z1331 Encounter for screening for depression: Secondary | ICD-10-CM | POA: Diagnosis not present

## 2021-09-24 DIAGNOSIS — J029 Acute pharyngitis, unspecified: Secondary | ICD-10-CM | POA: Diagnosis not present

## 2021-09-24 DIAGNOSIS — J40 Bronchitis, not specified as acute or chronic: Secondary | ICD-10-CM | POA: Diagnosis not present

## 2021-09-24 DIAGNOSIS — Z1152 Encounter for screening for COVID-19: Secondary | ICD-10-CM | POA: Diagnosis not present

## 2021-09-24 DIAGNOSIS — R051 Acute cough: Secondary | ICD-10-CM | POA: Diagnosis not present

## 2021-09-24 DIAGNOSIS — J069 Acute upper respiratory infection, unspecified: Secondary | ICD-10-CM | POA: Diagnosis not present

## 2021-09-24 DIAGNOSIS — R0981 Nasal congestion: Secondary | ICD-10-CM | POA: Diagnosis not present

## 2021-09-24 DIAGNOSIS — R5383 Other fatigue: Secondary | ICD-10-CM | POA: Diagnosis not present

## 2021-09-25 DIAGNOSIS — Z20828 Contact with and (suspected) exposure to other viral communicable diseases: Secondary | ICD-10-CM | POA: Diagnosis not present

## 2021-09-30 DIAGNOSIS — J40 Bronchitis, not specified as acute or chronic: Secondary | ICD-10-CM | POA: Diagnosis not present

## 2021-09-30 DIAGNOSIS — J069 Acute upper respiratory infection, unspecified: Secondary | ICD-10-CM | POA: Diagnosis not present

## 2021-09-30 DIAGNOSIS — R0789 Other chest pain: Secondary | ICD-10-CM | POA: Diagnosis not present

## 2021-09-30 DIAGNOSIS — R051 Acute cough: Secondary | ICD-10-CM | POA: Diagnosis not present

## 2021-12-22 DIAGNOSIS — Z20822 Contact with and (suspected) exposure to covid-19: Secondary | ICD-10-CM | POA: Diagnosis not present

## 2022-03-02 DIAGNOSIS — Z20822 Contact with and (suspected) exposure to covid-19: Secondary | ICD-10-CM | POA: Diagnosis not present

## 2022-03-16 DIAGNOSIS — H40023 Open angle with borderline findings, high risk, bilateral: Secondary | ICD-10-CM | POA: Diagnosis not present

## 2022-03-16 DIAGNOSIS — H524 Presbyopia: Secondary | ICD-10-CM | POA: Diagnosis not present

## 2022-03-16 DIAGNOSIS — Z961 Presence of intraocular lens: Secondary | ICD-10-CM | POA: Diagnosis not present

## 2022-04-02 DIAGNOSIS — Z85828 Personal history of other malignant neoplasm of skin: Secondary | ICD-10-CM | POA: Diagnosis not present

## 2022-04-02 DIAGNOSIS — L821 Other seborrheic keratosis: Secondary | ICD-10-CM | POA: Diagnosis not present

## 2022-04-02 DIAGNOSIS — L918 Other hypertrophic disorders of the skin: Secondary | ICD-10-CM | POA: Diagnosis not present

## 2022-06-01 ENCOUNTER — Other Ambulatory Visit: Payer: Self-pay | Admitting: Obstetrics and Gynecology

## 2022-06-01 DIAGNOSIS — Z1231 Encounter for screening mammogram for malignant neoplasm of breast: Secondary | ICD-10-CM

## 2022-06-01 DIAGNOSIS — Z6823 Body mass index (BMI) 23.0-23.9, adult: Secondary | ICD-10-CM | POA: Diagnosis not present

## 2022-06-01 DIAGNOSIS — Z01419 Encounter for gynecological examination (general) (routine) without abnormal findings: Secondary | ICD-10-CM | POA: Diagnosis not present

## 2022-06-11 ENCOUNTER — Ambulatory Visit
Admission: RE | Admit: 2022-06-11 | Discharge: 2022-06-11 | Disposition: A | Discharge status: Home / Self Care | Payer: Medicare Other | Source: Ambulatory Visit | Attending: Obstetrics and Gynecology | Admitting: Obstetrics and Gynecology

## 2022-06-11 DIAGNOSIS — Z1231 Encounter for screening mammogram for malignant neoplasm of breast: Secondary | ICD-10-CM

## 2022-08-04 DIAGNOSIS — Z23 Encounter for immunization: Secondary | ICD-10-CM | POA: Diagnosis not present

## 2022-08-25 DIAGNOSIS — H6240 Otitis externa in other diseases classified elsewhere, unspecified ear: Secondary | ICD-10-CM | POA: Diagnosis not present

## 2022-08-25 DIAGNOSIS — R5383 Other fatigue: Secondary | ICD-10-CM | POA: Diagnosis not present

## 2022-08-25 DIAGNOSIS — Z1152 Encounter for screening for COVID-19: Secondary | ICD-10-CM | POA: Diagnosis not present

## 2022-08-25 DIAGNOSIS — H6121 Impacted cerumen, right ear: Secondary | ICD-10-CM | POA: Diagnosis not present

## 2022-08-25 DIAGNOSIS — B369 Superficial mycosis, unspecified: Secondary | ICD-10-CM | POA: Diagnosis not present

## 2022-08-25 DIAGNOSIS — R059 Cough, unspecified: Secondary | ICD-10-CM | POA: Diagnosis not present

## 2022-08-25 DIAGNOSIS — J209 Acute bronchitis, unspecified: Secondary | ICD-10-CM | POA: Diagnosis not present

## 2022-08-25 DIAGNOSIS — J302 Other seasonal allergic rhinitis: Secondary | ICD-10-CM | POA: Diagnosis not present

## 2022-08-25 DIAGNOSIS — J0100 Acute maxillary sinusitis, unspecified: Secondary | ICD-10-CM | POA: Diagnosis not present

## 2022-08-26 DIAGNOSIS — E785 Hyperlipidemia, unspecified: Secondary | ICD-10-CM | POA: Diagnosis not present

## 2022-08-26 DIAGNOSIS — R7989 Other specified abnormal findings of blood chemistry: Secondary | ICD-10-CM | POA: Diagnosis not present

## 2022-09-01 DIAGNOSIS — M1990 Unspecified osteoarthritis, unspecified site: Secondary | ICD-10-CM | POA: Diagnosis not present

## 2022-09-01 DIAGNOSIS — R82998 Other abnormal findings in urine: Secondary | ICD-10-CM | POA: Diagnosis not present

## 2022-09-01 DIAGNOSIS — Z0000 Encounter for general adult medical examination without abnormal findings: Secondary | ICD-10-CM | POA: Diagnosis not present

## 2022-09-01 DIAGNOSIS — H9209 Otalgia, unspecified ear: Secondary | ICD-10-CM | POA: Diagnosis not present

## 2022-09-01 DIAGNOSIS — J069 Acute upper respiratory infection, unspecified: Secondary | ICD-10-CM | POA: Diagnosis not present

## 2022-09-01 DIAGNOSIS — I6523 Occlusion and stenosis of bilateral carotid arteries: Secondary | ICD-10-CM | POA: Diagnosis not present

## 2022-09-01 DIAGNOSIS — E785 Hyperlipidemia, unspecified: Secondary | ICD-10-CM | POA: Diagnosis not present

## 2022-09-15 DIAGNOSIS — Z23 Encounter for immunization: Secondary | ICD-10-CM | POA: Diagnosis not present

## 2022-11-17 DIAGNOSIS — Z3141 Encounter for fertility testing: Secondary | ICD-10-CM | POA: Diagnosis not present

## 2023-01-06 DIAGNOSIS — M25562 Pain in left knee: Secondary | ICD-10-CM | POA: Diagnosis not present

## 2023-03-02 DIAGNOSIS — S6981XA Other specified injuries of right wrist, hand and finger(s), initial encounter: Secondary | ICD-10-CM | POA: Diagnosis not present

## 2023-03-03 DIAGNOSIS — S6981XA Other specified injuries of right wrist, hand and finger(s), initial encounter: Secondary | ICD-10-CM | POA: Diagnosis not present

## 2023-03-08 DIAGNOSIS — S6981XA Other specified injuries of right wrist, hand and finger(s), initial encounter: Secondary | ICD-10-CM | POA: Diagnosis not present

## 2023-03-12 DIAGNOSIS — S6981XA Other specified injuries of right wrist, hand and finger(s), initial encounter: Secondary | ICD-10-CM | POA: Diagnosis not present

## 2023-03-17 DIAGNOSIS — S6981XA Other specified injuries of right wrist, hand and finger(s), initial encounter: Secondary | ICD-10-CM | POA: Diagnosis not present

## 2023-03-18 DIAGNOSIS — H40053 Ocular hypertension, bilateral: Secondary | ICD-10-CM | POA: Diagnosis not present

## 2023-03-19 DIAGNOSIS — S6981XA Other specified injuries of right wrist, hand and finger(s), initial encounter: Secondary | ICD-10-CM | POA: Diagnosis not present

## 2023-03-22 ENCOUNTER — Other Ambulatory Visit: Payer: Self-pay | Admitting: Obstetrics and Gynecology

## 2023-03-22 DIAGNOSIS — Z1231 Encounter for screening mammogram for malignant neoplasm of breast: Secondary | ICD-10-CM

## 2023-03-23 DIAGNOSIS — S6981XA Other specified injuries of right wrist, hand and finger(s), initial encounter: Secondary | ICD-10-CM | POA: Diagnosis not present

## 2023-03-25 DIAGNOSIS — Z85828 Personal history of other malignant neoplasm of skin: Secondary | ICD-10-CM | POA: Diagnosis not present

## 2023-03-25 DIAGNOSIS — L570 Actinic keratosis: Secondary | ICD-10-CM | POA: Diagnosis not present

## 2023-03-31 DIAGNOSIS — S6981XA Other specified injuries of right wrist, hand and finger(s), initial encounter: Secondary | ICD-10-CM | POA: Diagnosis not present

## 2023-04-07 DIAGNOSIS — S6981XA Other specified injuries of right wrist, hand and finger(s), initial encounter: Secondary | ICD-10-CM | POA: Diagnosis not present

## 2023-04-08 DIAGNOSIS — M79641 Pain in right hand: Secondary | ICD-10-CM | POA: Diagnosis not present

## 2023-04-14 DIAGNOSIS — S6981XA Other specified injuries of right wrist, hand and finger(s), initial encounter: Secondary | ICD-10-CM | POA: Diagnosis not present

## 2023-04-21 DIAGNOSIS — S6981XA Other specified injuries of right wrist, hand and finger(s), initial encounter: Secondary | ICD-10-CM | POA: Diagnosis not present

## 2023-04-28 DIAGNOSIS — S6981XA Other specified injuries of right wrist, hand and finger(s), initial encounter: Secondary | ICD-10-CM | POA: Diagnosis not present

## 2023-05-05 DIAGNOSIS — S6981XA Other specified injuries of right wrist, hand and finger(s), initial encounter: Secondary | ICD-10-CM | POA: Diagnosis not present

## 2023-05-24 DIAGNOSIS — M13841 Other specified arthritis, right hand: Secondary | ICD-10-CM | POA: Diagnosis not present

## 2023-05-24 DIAGNOSIS — M79644 Pain in right finger(s): Secondary | ICD-10-CM | POA: Diagnosis not present

## 2023-05-25 DIAGNOSIS — H6123 Impacted cerumen, bilateral: Secondary | ICD-10-CM | POA: Diagnosis not present

## 2023-06-16 ENCOUNTER — Ambulatory Visit: Payer: Medicare Other

## 2023-07-06 ENCOUNTER — Ambulatory Visit
Admission: RE | Admit: 2023-07-06 | Discharge: 2023-07-06 | Disposition: A | Discharge status: Home / Self Care | Payer: Medicare Other | Source: Ambulatory Visit | Attending: Obstetrics and Gynecology | Admitting: Obstetrics and Gynecology

## 2023-07-06 DIAGNOSIS — Z1231 Encounter for screening mammogram for malignant neoplasm of breast: Secondary | ICD-10-CM | POA: Diagnosis not present

## 2023-07-08 DIAGNOSIS — L570 Actinic keratosis: Secondary | ICD-10-CM | POA: Diagnosis not present

## 2023-07-08 DIAGNOSIS — Z85828 Personal history of other malignant neoplasm of skin: Secondary | ICD-10-CM | POA: Diagnosis not present

## 2023-07-08 DIAGNOSIS — L820 Inflamed seborrheic keratosis: Secondary | ICD-10-CM | POA: Diagnosis not present

## 2023-07-08 DIAGNOSIS — L578 Other skin changes due to chronic exposure to nonionizing radiation: Secondary | ICD-10-CM | POA: Diagnosis not present

## 2023-07-12 DIAGNOSIS — Z6822 Body mass index (BMI) 22.0-22.9, adult: Secondary | ICD-10-CM | POA: Diagnosis not present

## 2023-07-12 DIAGNOSIS — Z124 Encounter for screening for malignant neoplasm of cervix: Secondary | ICD-10-CM | POA: Diagnosis not present

## 2023-07-12 DIAGNOSIS — Z1151 Encounter for screening for human papillomavirus (HPV): Secondary | ICD-10-CM | POA: Diagnosis not present

## 2023-07-14 ENCOUNTER — Ambulatory Visit
Admission: RE | Admit: 2023-07-14 | Discharge: 2023-07-14 | Disposition: A | Discharge status: Home / Self Care | Payer: Medicare Other | Source: Ambulatory Visit | Attending: Registered Nurse | Admitting: Registered Nurse

## 2023-07-14 ENCOUNTER — Encounter: Payer: Self-pay | Admitting: Registered Nurse

## 2023-07-14 ENCOUNTER — Other Ambulatory Visit: Payer: Self-pay | Admitting: Registered Nurse

## 2023-07-14 DIAGNOSIS — R3129 Other microscopic hematuria: Secondary | ICD-10-CM

## 2023-07-14 DIAGNOSIS — D259 Leiomyoma of uterus, unspecified: Secondary | ICD-10-CM | POA: Diagnosis not present

## 2023-07-14 DIAGNOSIS — N390 Urinary tract infection, site not specified: Secondary | ICD-10-CM | POA: Diagnosis not present

## 2023-07-14 DIAGNOSIS — K5790 Diverticulosis of intestine, part unspecified, without perforation or abscess without bleeding: Secondary | ICD-10-CM | POA: Diagnosis not present

## 2023-07-14 DIAGNOSIS — R1032 Left lower quadrant pain: Secondary | ICD-10-CM

## 2023-07-14 DIAGNOSIS — K5900 Constipation, unspecified: Secondary | ICD-10-CM | POA: Diagnosis not present

## 2023-07-14 DIAGNOSIS — N134 Hydroureter: Secondary | ICD-10-CM | POA: Diagnosis not present

## 2023-07-14 DIAGNOSIS — R112 Nausea with vomiting, unspecified: Secondary | ICD-10-CM

## 2023-07-14 DIAGNOSIS — D72825 Bandemia: Secondary | ICD-10-CM | POA: Diagnosis not present

## 2023-07-14 DIAGNOSIS — N201 Calculus of ureter: Secondary | ICD-10-CM | POA: Diagnosis not present

## 2023-07-14 DIAGNOSIS — N1330 Unspecified hydronephrosis: Secondary | ICD-10-CM | POA: Diagnosis not present

## 2023-07-14 MED ORDER — IOPAMIDOL (ISOVUE-300) INJECTION 61%
100.0000 mL | Freq: Once | INTRAVENOUS | Status: AC | PRN
  Administered 2023-07-14: 80 mL via INTRAVENOUS

## 2023-07-15 ENCOUNTER — Encounter (HOSPITAL_COMMUNITY): Payer: Self-pay

## 2023-07-15 ENCOUNTER — Other Ambulatory Visit: Payer: Self-pay

## 2023-07-15 ENCOUNTER — Emergency Department (HOSPITAL_COMMUNITY)
Admission: EM | Admit: 2023-07-15 | Discharge: 2023-07-15 | Disposition: A | Discharge status: Home / Self Care | Payer: Medicare Other | Attending: Emergency Medicine | Admitting: Emergency Medicine

## 2023-07-15 DIAGNOSIS — N23 Unspecified renal colic: Secondary | ICD-10-CM | POA: Diagnosis not present

## 2023-07-15 DIAGNOSIS — R109 Unspecified abdominal pain: Secondary | ICD-10-CM | POA: Diagnosis present

## 2023-07-15 LAB — COMPREHENSIVE METABOLIC PANEL
ALT: 20 U/L (ref 0–44)
AST: 23 U/L (ref 15–41)
Albumin: 3.2 g/dL — ABNORMAL LOW (ref 3.5–5.0)
Alkaline Phosphatase: 48 U/L (ref 38–126)
Anion gap: 10 (ref 5–15)
BUN: 14 mg/dL (ref 8–23)
CO2: 25 mmol/L (ref 22–32)
Calcium: 8.6 mg/dL — ABNORMAL LOW (ref 8.9–10.3)
Chloride: 91 mmol/L — ABNORMAL LOW (ref 98–111)
Creatinine, Ser: 0.73 mg/dL (ref 0.44–1.00)
GFR, Estimated: >60 mL/min (ref >60)
Glucose, Bld: 127 mg/dL — ABNORMAL HIGH (ref 70–99)
Potassium: 4 mmol/L (ref 3.5–5.1)
Sodium: 126 mmol/L — ABNORMAL LOW (ref 135–145)
Total Bilirubin: 0.6 mg/dL (ref 0.3–1.2)
Total Protein: 6.1 g/dL — ABNORMAL LOW (ref 6.5–8.1)

## 2023-07-15 LAB — URINALYSIS, ROUTINE W REFLEX MICROSCOPIC
Bacteria, UA: NONE SEEN
Bilirubin Urine: NEGATIVE
Glucose, UA: NEGATIVE mg/dL
Ketones, ur: 20 mg/dL — AB
Nitrite: NEGATIVE
Protein, ur: NEGATIVE mg/dL
RBC / HPF: >50 RBC/hpf (ref 0–5)
Specific Gravity, Urine: 1.018 (ref 1.005–1.030)
pH: 5 (ref 5.0–8.0)

## 2023-07-15 LAB — CBC
HCT: 32.2 % — ABNORMAL LOW (ref 36.0–46.0)
Hemoglobin: 10.5 g/dL — ABNORMAL LOW (ref 12.0–15.0)
MCH: 28.8 pg (ref 26.0–34.0)
MCHC: 32.6 g/dL (ref 30.0–36.0)
MCV: 88.5 fL (ref 80.0–100.0)
Platelets: 263 10*3/uL (ref 150–400)
RBC: 3.64 MIL/uL — ABNORMAL LOW (ref 3.87–5.11)
RDW: 11.9 % (ref 11.5–15.5)
WBC: 12.9 10*3/uL — ABNORMAL HIGH (ref 4.0–10.5)
nRBC: 0 % (ref 0.0–0.2)

## 2023-07-15 MED ORDER — SODIUM CHLORIDE 0.9 % IV BOLUS
1000.0000 mL | Freq: Once | INTRAVENOUS | Status: AC
  Administered 2023-07-15: 1000 mL via INTRAVENOUS

## 2023-07-15 MED ORDER — KETOROLAC TROMETHAMINE 30 MG/ML IJ SOLN
30.0000 mg | Freq: Once | INTRAMUSCULAR | Status: AC
Start: 2023-07-15 — End: 2023-07-15
  Administered 2023-07-15: 30 mg via INTRAVENOUS
  Filled 2023-07-15: qty 1

## 2023-07-15 MED ORDER — MORPHINE SULFATE (PF) 4 MG/ML IV SOLN
4.0000 mg | Freq: Once | INTRAVENOUS | Status: AC
  Administered 2023-07-15: 4 mg via INTRAVENOUS
  Filled 2023-07-15: qty 1

## 2023-07-15 MED ORDER — ONDANSETRON HCL 4 MG/2ML IJ SOLN
4.0000 mg | Freq: Once | INTRAMUSCULAR | Status: AC
Start: 2023-07-15 — End: 2023-07-15
  Administered 2023-07-15: 4 mg via INTRAVENOUS
  Filled 2023-07-15: qty 2

## 2023-07-15 MED ORDER — IBUPROFEN 800 MG PO TABS: 800.0000 mg | ORAL_TABLET | Freq: Three times a day (TID) | ORAL | 0 refills | Status: DC

## 2023-07-15 NOTE — ED Notes (Signed)
ED Provider at bedside. 

## 2023-07-15 NOTE — Discharge Instructions (Signed)
As we discussed, you have pain from kidney stone and I recommend 800 mg of ibuprofen 3 times daily.  If you are taking Celebrex you need to stop it temporarily while you are taking ibuprofen  You can continue to take your pain medicine as prescribed by your doctor.  Please call urologist tomorrow for appointment  Return to ER if you have worse abdominal pain or vomiting

## 2023-07-15 NOTE — ED Triage Notes (Signed)
Patient reports being dx with left sided kidney stone 2 days ago. Given flomax, zofran, and percocet. Reports today she has had increased pain, urinary retention, nausea, and vomiting. PCP told patient to come here.  VSS, NAD

## 2023-07-15 NOTE — ED Provider Notes (Signed)
[place] EMERGENCY DEPARTMENT AT [facility] Provider Note   CSN: [id number] Arrival date & time: 07/15/23  1419     History  Chief Complaint  Patient presents with   Emesis   Flank Pain    Desiree Byrd is a 72 y.o. female here presenting with vomiting and flank pain.  Patient was having left flank pain for the last 2 days.  Went to PCP yesterday and had a CT outpatient that showed 3 mm left ureteral calculus with hydronephrosis.  Patient was prescribed Percocet and Zofran and Flomax.  She states that she has decreased urine output.  She also states that she has persistent vomiting.  Patient denies any fevers or chills.  Patient called PCP and was sent here for further evaluation.  The history is provided by the patient.       Home Medications Prior to Admission medications   Medication Sig Start Date End Date Taking? Authorizing Provider  Ascorbic Acid (VITAMIN C) 100 MG tablet Take 100 mg by mouth daily.    [provider]  celecoxib (CELEBREX) 100 MG capsule Take 200 mg by mouth daily.    [provider]  Cholecalciferol (VITAMIN D3) 30 MCG/15ML LIQD Take by mouth.    [provider]  Copper 5 MG CAPS Take by mouth.    [provider]  HYDROcodone-acetaminophen (NORCO/VICODIN) 5-325 MG tablet Take 1 tablet by mouth every 6 (six) hours as needed for moderate pain. 12/12/20   Cornett, Maisie Fus, MD  Lysine 1000 MG TABS Take 1 tablet by mouth daily.    [provider]  naproxen sodium (ALEVE) 220 MG tablet Take 220 mg by mouth 2 (two) times daily as needed.    [provider]  rosuvastatin (CRESTOR) 5 MG tablet rosuvastatin 5 mg tablet   1 tablet every other day by oral route. 06/16/18   [provider]  zinc gluconate 50 MG tablet Take 50 mg by mouth daily.    [provider]  zolpidem (AMBIEN CR) 12.5 MG CR tablet Take 1 tablet (12.5 mg total) by mouth at bedtime as needed for sleep. 12/12/20 12/12/21   Harriette Bouillon, MD      Allergies    Patient has no known allergies.    Review of Systems   Review of Systems  Gastrointestinal:  Positive for vomiting.  Genitourinary:  Positive for flank pain.  All other systems reviewed and are negative.   Physical Exam Updated Vital Signs BP 137/70   Pulse 88   Temp 99 F (37.2 C) (Oral)   Resp 18   Ht 5\' 7"  (1.702 m)   Wt 64.4 kg   SpO2 99%   BMI 22.24 kg/m  Physical Exam Vitals and nursing note reviewed.  HENT:     Head: Normocephalic.     Nose: Nose normal.     Mouth/Throat:     Mouth: Mucous membranes are moist.  Eyes:     Extraocular Movements: Extraocular movements intact.     Pupils: Pupils are equal, round, and reactive to light.  Cardiovascular:     Rate and Rhythm: Normal rate and regular rhythm.     Pulses: Normal pulses.     Heart sounds: Normal heart sounds.  Pulmonary:     Effort: Pulmonary effort is normal.     Breath sounds: Normal breath sounds.  Abdominal:     General: Abdomen is flat.     Comments: Mild left CVA tenderness and suprapubic tenderness  Musculoskeletal:  General: Normal range of motion.     Cervical back: Normal range of motion.  Skin:    General: Skin is warm.     Capillary Refill: Capillary refill takes less than 2 seconds.  Neurological:     General: No focal deficit present.     Mental Status: She is alert and oriented to person, place, and time.  Psychiatric:        Mood and Affect: Mood normal.        Behavior: Behavior normal.     ED Results / Procedures / Treatments   Labs (all labs ordered are listed, but only abnormal results are displayed) Labs Reviewed  URINALYSIS, ROUTINE W REFLEX MICROSCOPIC - Abnormal; Notable for the following components:      Result Value   APPearance HAZY (*)    Hgb urine dipstick MODERATE (*)    Ketones, ur 20 (*)    Leukocytes,Ua TRACE (*)    All other components within normal limits  CBC - Abnormal; Notable for the following  components:   WBC 12.9 (*)    RBC 3.64 (*)    Hemoglobin 10.5 (*)    HCT 32.2 (*)    All other components within normal limits  COMPREHENSIVE METABOLIC PANEL - Abnormal; Notable for the following components:   Sodium 126 (*)    Chloride 91 (*)    Glucose, Bld 127 (*)    Calcium 8.6 (*)    Total Protein 6.1 (*)    Albumin 3.2 (*)    All other components within normal limits    EKG None  Radiology CT ABDOMEN PELVIS W CONTRAST  Result Date: 07/14/2023 CLINICAL DATA:  Left lower quadrant pain, microscopic hematuria, nausea, vomiting, chills and bloating EXAM: CT ABDOMEN AND PELVIS WITH CONTRAST TECHNIQUE: Multidetector CT imaging of the abdomen and pelvis was performed using the standard protocol following bolus administration of intravenous contrast. RADIATION DOSE REDUCTION: This exam was performed according to the departmental dose-optimization program which includes automated exposure control, adjustment of the mA and/or kV according to patient size and/or use of iterative reconstruction technique. CONTRAST:  80mL ISOVUE-300 IOPAMIDOL (ISOVUE-300) INJECTION 61% COMPARISON:  03/08/2011 FINDINGS: Lower chest: No acute abnormality. Cardiomegaly. Dependent bibasilar scarring or atelectasis. Hepatobiliary: No solid liver abnormality is seen. No gallstones, gallbladder wall thickening, or biliary dilatation. Pancreas: Unremarkable. No pancreatic ductal dilatation or surrounding inflammatory changes. Spleen: Normal in size without significant abnormality. Adrenals/Urinary Tract: Adrenal glands are unremarkable. There is a 0.3 cm calculus at the left ureterovesicular junction with mild associated hydronephrosis and hydroureter as well as extensive left-sided perinephric fat stranding. Additional punctuate nonobstructive right renal calculi. No right-sided hydronephrosis. Bladder is unremarkable. Stomach/Bowel: Stomach is within normal limits. Appendix appears normal. No evidence of bowel wall  thickening, distention, or inflammatory changes. Vascular/Lymphatic: No significant vascular findings are present. No enlarged abdominal or pelvic lymph nodes. Reproductive: Calcified uterine fibroids. Large burden of stool throughout the colon and rectum. Other: No abdominal wall hernia or abnormality. Small volume free fluid in the low pelvis. Musculoskeletal: No acute or significant osseous findings. IMPRESSION: 1. There is a 0.3 cm calculus at the left ureterovesicular junction with mild associated hydronephrosis and hydroureter as well as extensive left-sided perinephric fat stranding. 2. Additional punctuate nonobstructive right renal calculi. No right-sided hydronephrosis. 3. Large burden of stool throughout the colon and rectum. 4. Small volume free fluid in the low pelvis, likely reactive. 5. Calcified uterine fibroids. These results will be called to the ordering clinician or representative  by the Radiologist Assistant, and communication documented in the PACS or [employer]. Electronically Signed   By: Jearld Lesch M.D.   On: 07/14/2023 13:43    Procedures Procedures    Medications Ordered in ED Medications  ketorolac (TORADOL) 30 MG/ML injection 30 mg (30 mg Intravenous Given 07/15/23 2113)  morphine (PF) 4 MG/ML injection 4 mg (4 mg Intravenous Given 07/15/23 2113)  ondansetron (ZOFRAN) injection 4 mg (4 mg Intravenous Given 07/15/23 2113)  sodium chloride 0.9 % bolus 1,000 mL (1,000 mLs Intravenous New Bag/Given 07/15/23 2150)    ED Course/ Medical Decision Making/ A&P                                 Medical Decision Making TKAI TILEY is a 72 y.o. female here presenting with left flank pain and decreased urine output.  Patient has been vomiting so I think she is likely dehydrated.  I reviewed her outpatient CT scan and it showed hydronephrosis with 3 mm stone.  Will recheck CBC and CMP and urinalysis.  Will give IV fluids and Toradol and Zofran and reassess.  10:15 PM I reviewed  patient's labs and independently interpreted imaging studies.  Patient's sodium is 126 and creatinine is normal.  UA showed blood but no UTI.  Patient given Toradol and pain medicine and felt better.  At this point I think she stable for discharge.  I recommend round-the-clock ibuprofen 800 mg every 8 hours.  She has Percocet and Zofran and Flomax at home.  Told her to call urology.  Problems Addressed: Renal colic on right side: acute illness or injury  Amount and/or Complexity of Data Reviewed Labs: ordered. Decision-making details documented in ED Course.  Risk Prescription drug management.    Final Clinical Impression(s) / ED Diagnoses Final diagnoses:  None    Rx / DC Orders ED Discharge Orders     None         Charlynne Pander, MD 07/15/23 2218

## 2023-07-16 DIAGNOSIS — N201 Calculus of ureter: Secondary | ICD-10-CM | POA: Diagnosis not present

## 2023-07-27 ENCOUNTER — Telehealth: Payer: Self-pay

## 2023-07-27 NOTE — Telephone Encounter (Signed)
Transition Care Management Follow-up Telephone Call Date of discharge and from where: 07/15/2023 [facility] How have you been since you were released from the hospital? Patient stated she is feeling much better. Any questions or concerns? No  Items Reviewed: Did the pt receive and understand the discharge instructions provided? Yes  Medications obtained and verified?  OTC Tylenol Other? No  Any new allergies since your discharge? No  Dietary orders reviewed? Yes Do you have support at home? Yes   Follow up appointments reviewed:  PCP Hospital f/u appt confirmed? Yes  Scheduled to see Alysia Penna, MD on 07/16/2023 @ [facility]. Specialist Hospital f/u appt confirmed? No  Scheduled to see  on  @ . Are transportation arrangements needed? No  If their condition worsens, is the pt aware to call PCP or go to the Emergency Dept.? Yes Was the patient provided with contact information for the PCP's office or ED? Yes Was to pt encouraged to call back with questions or concerns? Yes  [place] Cruzan [facility]  [facility], [facility] Guide Direct Dial: [phone]  Website: [email or web address]

## 2023-08-04 ENCOUNTER — Ambulatory Visit (INDEPENDENT_AMBULATORY_CARE_PROVIDER_SITE_OTHER): Payer: Medicare Other | Admitting: Sports Medicine

## 2023-08-04 VITALS — BP 112/78 | Ht 68.0 in | Wt 140.0 lb

## 2023-08-04 DIAGNOSIS — R269 Unspecified abnormalities of gait and mobility: Secondary | ICD-10-CM | POA: Diagnosis not present

## 2023-08-04 NOTE — Progress Notes (Signed)
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 and some spurring at the ankle which gradually resolved with physical therapy  Her orthotics are about 73 years old and wearing out so she comes for new orthotics  Physical exam Thin older female in no acute distress BP 112/78   Ht 5\' 8"  (1.727 m)   Wt 140 lb (63.5 kg)   BMI 21.29 kg/m  Walking gait reveals that she has significant varus thrust particularly on the right Feet demonstrate that the left shows significant loss of the longitudinal arch as well as some transverse arch breakdown The right shows lateral deviation of the lateral column of the foot There is some collapse of the long arch There is some collapse of the transverse arch  Patient was fitted for a : standard, cushioned, semi-rigid orthotic. The orthotic was heated and afterward the patient stood on the orthotic blank positioned on the orthotic stand. The patient was positioned in subtalar neutral position and 10 degrees of ankle dorsiflexion in a weight bearing stance. After completion of molding, a stable base was applied to the orthotic blank. The blank was ground to a stable position for weight bearing. Size: 8 EVA with incorporated base Base: heel wedge bilat Posting: lateral post on RT/ Medial left Additional orthotic padding: MT padding  Prepared 2 pairs of orthotics for the patient to use in walking and in tennis shoes  Completion her gait was more neutral and she had good comfort with the orthotics

## 2023-08-04 NOTE — Assessment & Plan Note (Signed)
With significant breakdown of both feet the patient needs custom orthotics to be able to stay physically active as she wishes These have done a good job in relieving her ankle pain and muscle her forefoot pain  Today we prepared 2 pairs of orthotics that she can use in her regular walking shoes as well as in the shoes which she uses for tennis

## 2023-08-31 DIAGNOSIS — E785 Hyperlipidemia, unspecified: Secondary | ICD-10-CM | POA: Diagnosis not present

## 2023-08-31 DIAGNOSIS — Z1389 Encounter for screening for other disorder: Secondary | ICD-10-CM | POA: Diagnosis not present

## 2023-09-01 ENCOUNTER — Encounter: Payer: Self-pay | Admitting: Sports Medicine

## 2023-09-01 ENCOUNTER — Ambulatory Visit (INDEPENDENT_AMBULATORY_CARE_PROVIDER_SITE_OTHER): Payer: Medicare Other | Admitting: Sports Medicine

## 2023-09-01 VITALS — BP 122/68 | Ht 68.0 in | Wt 142.0 lb

## 2023-09-01 DIAGNOSIS — M542 Cervicalgia: Secondary | ICD-10-CM | POA: Diagnosis not present

## 2023-09-01 MED ORDER — PREDNISONE 10 MG PO TABS: ORAL_TABLET | ORAL | 0 refills | Status: DC

## 2023-09-01 NOTE — Progress Notes (Addendum)
PCP: Alysia Penna, MD  Chief Complaint: Neck pain Subjective:   HPI: Patient is a 72 y.o. female here for Neck pain that radiates bilaterally into her traps.  Patient states that the pain started randomly approximately 1 month ago.  At that time she states that the pain felt like shooting pain into her traps starting from her neck.  Patient has been taking Aleve and states that the pain is slightly decreased but is still present. Patient notes no numbness tingling down into her arms and states that the pain is located primarily in the neck region as well as traps.  Patient states that she has been able to play tennis but has difficulty with things such as her back hand  Past Medical History:  Diagnosis Date   Arthritis    Carotid artery occlusion    Fasting hyperglycemia    Lung collapse    x2   PONV (postoperative nausea and vomiting)     Current Outpatient Medications on File Prior to Visit  Medication Sig Dispense Refill   Ascorbic Acid (VITAMIN C) 100 MG tablet Take 100 mg by mouth daily.     celecoxib (CELEBREX) 100 MG capsule Take 200 mg by mouth daily.     Cholecalciferol (VITAMIN D3) 30 MCG/15ML LIQD Take by mouth.     Copper 5 MG CAPS Take by mouth.     HYDROcodone-acetaminophen (NORCO/VICODIN) 5-325 MG tablet Take 1 tablet by mouth every 6 (six) hours as needed for moderate pain. 15 tablet 0   ibuprofen (ADVIL) 800 MG tablet Take 1 tablet (800 mg total) by mouth 3 (three) times daily. 30 tablet 0   Lysine 1000 MG TABS Take 1 tablet by mouth daily.     naproxen sodium (ALEVE) 220 MG tablet Take 220 mg by mouth 2 (two) times daily as needed.     rosuvastatin (CRESTOR) 5 MG tablet rosuvastatin 5 mg tablet   1 tablet every other day by oral route.     zinc gluconate 50 MG tablet Take 50 mg by mouth daily.     zolpidem (AMBIEN CR) 12.5 MG CR tablet Take 1 tablet (12.5 mg total) by mouth at bedtime as needed for sleep. 14 tablet 1   No current facility-administered medications  on file prior to visit.    Past Surgical History:  Procedure Laterality Date   ANKLE SURGERY     COLONOSCOPY     Dr Juanda Chance   EYE SURGERY     INGUINAL HERNIA REPAIR Right 12/12/2020   Procedure: RIGHT INGUINAL HERNIA REPAIR;  Surgeon: Harriette Bouillon, MD;  Location: [facility];  Service: General;  Laterality: Right;   INSERTION OF MESH Right 12/12/2020   Procedure: INSERTION OF MESH;  Surgeon: Harriette Bouillon, MD;  Location: [facility];  Service: General;  Laterality: Right;   KNEE ARTHROSCOPY     LUMBAR LAMINECTOMY     x 2   LUNG SURGERY  6 years ago   due to lung collapse   SHOULDER SURGERY Right 12/17/2014   THYROIDECTOMY     Partial-for nodule    No Known Allergies  BP 122/68   Ht 5\' 8"  (1.727 m)   Wt 142 lb (64.4 kg)   BMI 21.59 kg/m       No data to display              No data to display              Objective:  Physical  Exam:  Gen: NAD, comfortable in exam room  Cervical spine:   - Inspection: no gross deformity or asymmetry, swelling or ecchymosis. No skin changes.  - Palpation: No TTP over the spinous processes, there is TTP over the cervical paraspinal muscles    - ROM: full active ROM of the cervical spine in flex/ext/SB/rotation, There is some noted pain and discomfort with extension though range of motion is appropriate  - Strength: 5/5 strength of upper extremity in C5-T1 nerve root distributions b/l  *C5: Shoulder abduction  *C6: Elbow flexion, Wrist extension  *C7: Elbow extension  *C8: Thumb extension, Wrist Ulnar deviation  *T1: Finger abduction    - Neuro: sensation intact in the C5-T1 nerve root distribution b/l; Tricep/Bicep DTR's   - Provocative Testing: Positive Spurling's Test    Assessment & Plan:  1. 1. Neck pain - Patient symptoms are likely consistent with cervical radiculopathy more likely in the C4/C5 region.  At this time, will recommend prednisone to decrease the inflammation of the affected  area.  Also advised patient to start doing isometric cervical strengthening exercises which were given and shown to patient.  We will also do x-rays at this time to evaluate the degree of compression as this is likely coming from degenerative changes.  Patient advised to follow-up in 4 weeks for reevaluation at that time. - DG Cervical Spine 2 or 3 views; Future    Brenton Grills MD, PGY-4  Sports Medicine Fellow [facility]  I observed and examined the patient with the [facility] resident and agree with assessment and plan.  Note reviewed and modified by me. Sterling Big, MD

## 2023-09-06 ENCOUNTER — Encounter (HOSPITAL_COMMUNITY): Payer: Medicare Other

## 2023-09-06 ENCOUNTER — Ambulatory Visit: Payer: Medicare Other | Admitting: Surgery

## 2023-09-06 DIAGNOSIS — Z0001 Encounter for general adult medical examination with abnormal findings: Secondary | ICD-10-CM | POA: Diagnosis not present

## 2023-09-07 ENCOUNTER — Ambulatory Visit
Admission: RE | Admit: 2023-09-07 | Discharge: 2023-09-07 | Disposition: A | Discharge status: Home / Self Care | Payer: Medicare Other | Source: Ambulatory Visit | Attending: Sports Medicine | Admitting: Sports Medicine

## 2023-09-07 DIAGNOSIS — M5412 Radiculopathy, cervical region: Secondary | ICD-10-CM | POA: Diagnosis not present

## 2023-09-07 DIAGNOSIS — Z1389 Encounter for screening for other disorder: Secondary | ICD-10-CM | POA: Diagnosis not present

## 2023-09-07 DIAGNOSIS — M1990 Unspecified osteoarthritis, unspecified site: Secondary | ICD-10-CM | POA: Diagnosis not present

## 2023-09-07 DIAGNOSIS — Z23 Encounter for immunization: Secondary | ICD-10-CM | POA: Diagnosis not present

## 2023-09-07 DIAGNOSIS — Z1212 Encounter for screening for malignant neoplasm of rectum: Secondary | ICD-10-CM | POA: Diagnosis not present

## 2023-09-07 DIAGNOSIS — I6523 Occlusion and stenosis of bilateral carotid arteries: Secondary | ICD-10-CM | POA: Diagnosis not present

## 2023-09-07 DIAGNOSIS — Z1339 Encounter for screening examination for other mental health and behavioral disorders: Secondary | ICD-10-CM | POA: Diagnosis not present

## 2023-09-07 DIAGNOSIS — R82998 Other abnormal findings in urine: Secondary | ICD-10-CM | POA: Diagnosis not present

## 2023-09-07 DIAGNOSIS — M542 Cervicalgia: Secondary | ICD-10-CM

## 2023-09-07 DIAGNOSIS — Z0000 Encounter for general adult medical examination without abnormal findings: Secondary | ICD-10-CM | POA: Diagnosis not present

## 2023-09-07 DIAGNOSIS — D649 Anemia, unspecified: Secondary | ICD-10-CM | POA: Diagnosis not present

## 2023-09-07 DIAGNOSIS — Z1331 Encounter for screening for depression: Secondary | ICD-10-CM | POA: Diagnosis not present

## 2023-09-07 DIAGNOSIS — E785 Hyperlipidemia, unspecified: Secondary | ICD-10-CM | POA: Diagnosis not present

## 2023-09-10 DIAGNOSIS — D649 Anemia, unspecified: Secondary | ICD-10-CM | POA: Diagnosis not present

## 2023-09-16 ENCOUNTER — Encounter: Payer: Self-pay | Admitting: Gastroenterology

## 2023-09-16 ENCOUNTER — Ambulatory Visit (INDEPENDENT_AMBULATORY_CARE_PROVIDER_SITE_OTHER): Payer: Medicare Other | Admitting: Gastroenterology

## 2023-09-16 ENCOUNTER — Other Ambulatory Visit (HOSPITAL_COMMUNITY): Payer: Self-pay

## 2023-09-16 VITALS — BP 97/60 | HR 61 | Ht 68.0 in | Wt 141.0 lb

## 2023-09-16 DIAGNOSIS — D509 Iron deficiency anemia, unspecified: Secondary | ICD-10-CM

## 2023-09-16 DIAGNOSIS — R195 Other fecal abnormalities: Secondary | ICD-10-CM

## 2023-09-16 MED ORDER — NA SULFATE-K SULFATE-MG SULF 17.5-3.13-1.6 GM/177ML PO SOLN: 1.0000 | Freq: Once | ORAL | 0 refills | Status: AC

## 2023-09-16 NOTE — Patient Instructions (Addendum)
You have been scheduled for a colonoscopy/EGD. Please follow written instructions given to you at your visit today.   Please pick up your prep supplies at the pharmacy within the next 1-3 days.  If you use inhalers (even only as needed), please bring them with you on the day of your procedure.  DO NOT TAKE 7 DAYS PRIOR TO TEST- Trulicity (dulaglutide) Ozempic, Wegovy (semaglutide) Mounjaro (tirzepatide) Bydureon Bcise (exanatide extended release)  DO NOT TAKE 1 DAY PRIOR TO YOUR TEST Rybelsus (semaglutide) Adlyxin (lixisenatide) Victoza (liraglutide) Byetta (exanatide) ___________________________________________________________________________  _______________________________________________________  If your blood pressure at your visit was 140/90 or greater, please contact your primary care physician to follow up on this.  _______________________________________________________  If you are age 85 or older, your body mass index should be between 23-30. Your Body mass index is 21.44 kg/m. If this is out of the aforementioned range listed, please consider follow up with your Primary Care Provider.  If you are age 90 or younger, your body mass index should be between 19-25. Your Body mass index is 21.44 kg/m. If this is out of the aformentioned range listed, please consider follow up with your Primary Care Provider.   It was a pleasure to see you today!  Thank you for trusting me with your gastrointestinal care!    Scott E.Cunningham,MD

## 2023-09-16 NOTE — Progress Notes (Signed)
Discussed the use of AI scribe software for clinical note transcription with the patient, who gave verbal consent to proceed.  History of Present Illness   Desiree Byrd is a 72 y.o. female with a history of arthritis who is referred to [place] by Alysia Penna, MD for evaluation of a recent decrease in hemoglobin from 14 to 9.2 over the past year, identified during a routine physical examination two weeks ago. She also reported positive fecal occult blood test results. Despite these findings, the patient denied any visible blood in the stool, changes in stool frequency or consistency, and any gastrointestinal symptoms such as nausea, vomiting, or abdominal pain.  The patient also reported a change in weight over the past year, which was intentional due to increased exercise and dietary modifications to manage arthritis. She has been off Celebrex and Aleve for the past week, which has led to increased regularity in bowel movements but also increased irritability.  The patient reported feeling run down and tired, with symptoms of shortness of breath, dizziness, and perceived ankle swelling. She also noted a low ferritin level, indicating possible iron deficiency.  The patient's last colonoscopy was in 2016, which revealed a small non-pre-cancerous polyp. There is no family history of gastrointestinal cancers. The patient has a history of an umbilical hernia repair 35 years ago and a groin hernia, but denied any current discomfort from these areas.   She has no family history of GI malignancy.    Past Medical History:  Diagnosis Date   Arthritis    Carotid artery occlusion    Fasting hyperglycemia    Lung collapse    x2   PONV (postoperative nausea and vomiting)      Past Surgical History:  Procedure Laterality Date   ANKLE SURGERY     COLONOSCOPY     Dr Juanda Chance   EYE SURGERY     INGUINAL HERNIA REPAIR Right 12/12/2020   Procedure: RIGHT INGUINAL HERNIA REPAIR;  Surgeon: Harriette Bouillon,  MD;  Location: [facility];  Service: General;  Laterality: Right;   INSERTION OF MESH Right 12/12/2020   Procedure: INSERTION OF MESH;  Surgeon: Harriette Bouillon, MD;  Location: [facility];  Service: General;  Laterality: Right;   KNEE ARTHROSCOPY     LUMBAR LAMINECTOMY     x 2   LUNG SURGERY  6 years ago   due to lung collapse   SHOULDER SURGERY Right 12/17/2014   THYROIDECTOMY     Partial-for nodule   Family History  Problem Relation Age of Onset   Colon polyps Mother    Hypertension Mother    Breast cancer Mother        in 62s   Lung cancer Mother    Heart disease Mother    Multiple sclerosis Father        Deceased   Stroke Neg Hx    Colon cancer Neg Hx    Social History   Tobacco Use   Smoking status: Never   Smokeless tobacco: Never  Vaping Use   Vaping status: Never Used  Substance Use Topics   Alcohol use: Not Currently    Comment:  socially   Drug use: No   Current Outpatient Medications  Medication Sig Dispense Refill   celecoxib (CELEBREX) 100 MG capsule Take 200 mg by mouth daily.     Cholecalciferol (VITAMIN D3) 30 MCG/15ML LIQD Take by mouth.     ibuprofen (ADVIL) 800 MG tablet Take 800 mg by  mouth every 8 (eight) hours as needed.     rosuvastatin (CRESTOR) 5 MG tablet rosuvastatin 5 mg tablet   1 tablet every other day by oral route.     zinc gluconate 50 MG tablet Take 50 mg by mouth daily.     Ascorbic Acid (VITAMIN C) 100 MG tablet Take 100 mg by mouth daily. (Patient not taking: Reported on 09/16/2023)     Copper 5 MG CAPS Take by mouth.     HYDROcodone-acetaminophen (NORCO/VICODIN) 5-325 MG tablet Take 1 tablet by mouth every 6 (six) hours as needed for moderate pain. 15 tablet 0   ibuprofen (ADVIL) 800 MG tablet Take 1 tablet (800 mg total) by mouth 3 (three) times daily. 30 tablet 0   Lysine 1000 MG TABS Take 1 tablet by mouth daily.     naproxen sodium (ALEVE) 220 MG tablet Take 220 mg by mouth 2 (two) times daily as  needed.     predniSONE (DELTASONE) 10 MG tablet Take as directed per MD instructions 21 tablet 0   zinc gluconate 50 MG tablet Take 50 mg by mouth daily.     zolpidem (AMBIEN CR) 12.5 MG CR tablet Take 1 tablet (12.5 mg total) by mouth at bedtime as needed for sleep. 14 tablet 1   No current facility-administered medications for this visit.   No Known Allergies   Review of Systems: All systems reviewed and negative except where noted in HPI.    No results found.  Physical Exam: BP 97/60   Pulse 61   Ht 5\' 8"  (1.727 m)   Wt 141 lb (64 kg)   BMI 21.44 kg/m  Constitutional: Pleasant,well-developed, Caucasian female in no acute distress. HEENT: Normocephalic and atraumatic. Conjunctivae are normal. No scleral icterus. Neck supple.  Cardiovascular: Normal rate, regular rhythm.  Pulmonary/chest: Effort normal and breath sounds normal. No wheezing, rales or rhonchi. Abdominal: Soft, nondistended, nontender. Bowel sounds active throughout. There are no masses palpable. No hepatomegaly. Extremities: no edema Lymphadenopathy: No cervical adenopathy noted. Neurological: Alert and oriented to person place and time. Skin: Skin is warm and dry. No rashes noted. Psychiatric: Normal mood and affect. Behavior is normal.  CBC    Component Value Date/Time   WBC 12.9 (H) 07/15/2023 1437   RBC 3.64 (L) 07/15/2023 1437   HGB 10.5 (L) 07/15/2023 1437   HCT 32.2 (L) 07/15/2023 1437   PLT 263 07/15/2023 1437   MCV 88.5 07/15/2023 1437   MCH 28.8 07/15/2023 1437   MCHC 32.6 07/15/2023 1437   RDW 11.9 07/15/2023 1437   LYMPHSABS 1.6 12/06/2020 1030   MONOABS 0.4 12/06/2020 1030   EOSABS 0.1 12/06/2020 1030   BASOSABS 0.0 12/06/2020 1030    CMP     Component Value Date/Time   NA 126 (L) 07/15/2023 1437   K 4.0 07/15/2023 1437   CL 91 (L) 07/15/2023 1437   CO2 25 07/15/2023 1437   GLUCOSE 127 (H) 07/15/2023 1437   BUN 14 07/15/2023 1437   CREATININE 0.73 07/15/2023 1437   CALCIUM  8.6 (L) 07/15/2023 1437   CALCIUM 9.9 02/26/2009 2306   PROT 6.1 (L) 07/15/2023 1437   ALBUMIN 3.2 (L) 07/15/2023 1437   AST 23 07/15/2023 1437   ALT 20 07/15/2023 1437   ALKPHOS 48 07/15/2023 1437   BILITOT 0.6 07/15/2023 1437   GFRNONAA >60 07/15/2023 1437   GFRAA  03/08/2011 1717    >60        The eGFR has been calculated using the  MDRD equation. This calculation has not been validated in all clinical situations. eGFR's persistently <60 mL/min signify possible Chronic Kidney Disease.       Latest Ref Rng & Units 07/15/2023    2:37 PM 12/06/2020   10:30 AM 03/08/2011    5:17 PM  CBC EXTENDED  WBC 4.0 - 10.5 K/uL 12.9  5.3  8.6   RBC 3.87 - 5.11 MIL/uL 3.64  4.36  4.47   Hemoglobin 12.0 - 15.0 g/dL 78.2  95.6  21.3   HCT 36.0 - 46.0 % 32.2  39.7  39.1   Platelets 150 - 400 K/uL 263  186  198   NEUT# 1.7 - 7.7 K/uL  3.1  7.1   Lymph# 0.7 - 4.0 K/uL  1.6  1.0       ASSESSMENT AND PLAN: Assessment and Plan    Iron Deficiency Anemia Iron deficiency anemia with hemoglobin levels dropping from 14 to 9.2 over the course of a year. Positive stool occult blood, no visible blood, no GI symptoms, intentional weight loss. Ferritin level at 9. Differential includes GI blood loss and iron malabsorption (celiac disease, H. pylori). Need for EGD and colonoscopy discussed, including procedure details and potential biopsies. - Schedule EGD and colonoscopy - Perform biopsies during EGD for celiac disease and H. pylori - Evaluate small intestine with pill camera if EGD and colonoscopy are inconclusive  Arthritis Arthritis managed by weight loss and avoiding NSAIDs due to anemia management. - Continue weight management and exercise - Avoid NSAIDs to prevent GI bleeding  The details, risks (including bleeding, perforation, infection, missed lesions, medication reactions and possible hospitalization or surgery if complications occur), benefits, and alternatives to EGD/colonoscopy with  possible biopsy and possible polypectomy were discussed with the patient and she consents to proceed.   Verenis Nicosia E. Tomasa Rand, MD [facility]  I spent a total of 30 minutes reviewing the patient's medical record, interviewing and examining the patient, discussing her diagnosis and management of her condition going forward, and documenting in the medical record       Alysia Penna, MD

## 2023-09-17 ENCOUNTER — Encounter (HOSPITAL_COMMUNITY)
Admission: RE | Admit: 2023-09-17 | Discharge: 2023-09-17 | Disposition: A | Discharge status: Home / Self Care | Payer: Medicare Other | Source: Ambulatory Visit | Attending: Internal Medicine | Admitting: Internal Medicine

## 2023-09-17 DIAGNOSIS — D509 Iron deficiency anemia, unspecified: Secondary | ICD-10-CM | POA: Diagnosis not present

## 2023-09-17 MED ORDER — FERUMOXYTOL INJECTION 510 MG/17 ML
510.0000 mg | INTRAVENOUS | Status: DC
  Administered 2023-09-17: 510 mg via INTRAVENOUS
  Filled 2023-09-17: qty 510

## 2023-09-20 ENCOUNTER — Other Ambulatory Visit: Payer: Self-pay | Admitting: *Deleted

## 2023-09-20 DIAGNOSIS — I6529 Occlusion and stenosis of unspecified carotid artery: Secondary | ICD-10-CM

## 2023-09-21 ENCOUNTER — Encounter: Payer: Medicare Other | Admitting: Gastroenterology

## 2023-09-21 ENCOUNTER — Ambulatory Visit (AMBULATORY_SURGERY_CENTER): Payer: Medicare Other | Admitting: Gastroenterology

## 2023-09-21 ENCOUNTER — Encounter: Payer: Self-pay | Admitting: Gastroenterology

## 2023-09-21 VITALS — BP 132/60 | HR 68 | Temp 98.2°F | Resp 8 | Ht 68.0 in | Wt 141.0 lb

## 2023-09-21 DIAGNOSIS — K2951 Unspecified chronic gastritis with bleeding: Secondary | ICD-10-CM | POA: Diagnosis not present

## 2023-09-21 DIAGNOSIS — D124 Benign neoplasm of descending colon: Secondary | ICD-10-CM | POA: Diagnosis not present

## 2023-09-21 DIAGNOSIS — D123 Benign neoplasm of transverse colon: Secondary | ICD-10-CM

## 2023-09-21 DIAGNOSIS — R195 Other fecal abnormalities: Secondary | ICD-10-CM

## 2023-09-21 DIAGNOSIS — K2950 Unspecified chronic gastritis without bleeding: Secondary | ICD-10-CM | POA: Diagnosis not present

## 2023-09-21 DIAGNOSIS — D509 Iron deficiency anemia, unspecified: Secondary | ICD-10-CM | POA: Diagnosis not present

## 2023-09-21 DIAGNOSIS — B9681 Helicobacter pylori [H. pylori] as the cause of diseases classified elsewhere: Secondary | ICD-10-CM | POA: Diagnosis not present

## 2023-09-21 MED ORDER — SODIUM CHLORIDE 0.9 % IV SOLN: 500.0000 mL | Freq: Once | INTRAVENOUS | Status: DC

## 2023-09-21 MED ORDER — SODIUM CHLORIDE 0.9 % IV SOLN
4.0000 mg | Freq: Once | INTRAVENOUS | Status: AC
  Administered 2023-09-21: 4 mg via INTRAVENOUS

## 2023-09-21 NOTE — Progress Notes (Signed)
Pt reports nausea in PACU Zofran given by CRNA. Nausea relieved prior to discharge. Complained of abdominal cramping. Levsin 2 tablets given. Cramping slightly improved but still present at discharge. Patient request to go home to ambulate.

## 2023-09-21 NOTE — Progress Notes (Signed)
Sedate, gd SR, tolerated procedure well, VSS, report to RN 

## 2023-09-21 NOTE — Progress Notes (Signed)
Pt's states no medical or surgical changes since previsit or office visit. 

## 2023-09-21 NOTE — Progress Notes (Signed)
History and Physical Interval Note:  09/21/2023 1:40 PM  Desiree Byrd  has presented today for endoscopic procedure(s), with the diagnosis of  Encounter Diagnoses  Name Primary?   Positive fecal occult blood test Yes   Iron deficiency anemia, unspecified iron deficiency anemia type   .  The various methods of evaluation and treatment have been discussed with the patient and/or family. After consideration of risks, benefits and other options for treatment, the patient has consented to  the endoscopic procedure(s).   The patient's history has been reviewed, patient examined, no change in status, stable for endoscopic procedure(s).  I have reviewed the patient's chart and labs.  Questions were answered to the patient's satisfaction.     Hannan Tetzlaff E. Tomasa Rand, MD [facility]

## 2023-09-21 NOTE — Op Note (Signed)
[facility] Patient Name: Desiree Byrd Procedure Date: 09/21/2023 1:44 PM MRN: [id number] Endoscopist: Lorin Picket E. Tomasa Rand , MD, [id number] Age: 72 Referring MD:  Date of Birth: 06-03-1951 Gender: Female Account #: [id number] Procedure:                Colonoscopy Indications:              Heme positive stool, Iron deficiency anemia Medicines:                Monitored Anesthesia Care Procedure:                Pre-Anesthesia Assessment:                           - Prior to the procedure, a History and Physical                            was performed, and patient medications and                            allergies were reviewed. The patient's tolerance of                            previous anesthesia was also reviewed. The risks                            and benefits of the procedure and the sedation                            options and risks were discussed with the patient.                            All questions were answered, and informed consent                            was obtained. Prior Anticoagulants: The patient has                            taken no anticoagulant or antiplatelet agents. ASA                            Grade Assessment: II - A patient with mild systemic                            disease. After reviewing the risks and benefits,                            the patient was deemed in satisfactory condition to                            undergo the procedure.                           After obtaining informed consent, the colonoscope  was passed under direct vision. Throughout the                            procedure, the patient's blood pressure, pulse, and                            oxygen saturations were monitored continuously. The                            Olympus Scope SN [id number] was introduced through the                            anus and advanced to the the cecum, identified by                            appendiceal  orifice and ileocecal valve. The                            colonoscopy was unusually difficult due to a                            redundant colon, significant looping and a tortuous                            colon. Successful completion of the procedure was                            aided by using manual pressure, withdrawing and                            reinserting the scope and straightening and                            shortening the scope to obtain bowel loop                            reduction. The patient tolerated the procedure                            fairly well. The quality of the bowel preparation                            was adequate. The ileocecal valve, appendiceal                            orifice, and rectum were photographed. The bowel                            preparation used was SUPREP via split dose                            instruction. Scope In: 1:57:47 PM Scope Out: 2:49:05 PM Scope Withdrawal Time: 0 hours 24 minutes 24 seconds  Total Procedure Duration:  0 hours 51 minutes 18 seconds  Findings:                 The perianal and digital rectal examinations were                            normal. Pertinent negatives include normal                            sphincter tone and no palpable rectal lesions.                           Five sessile polyps were found in the transverse                            colon. The polyps were 2 to 5 mm in size. These                            polyps were removed with a cold snare. Resection                            and retrieval were complete. Estimated blood loss                            was minimal.                           A 4 mm polyp was found in the descending colon. The                            polyp was sessile. The polyp was removed with a                            cold snare. Resection and retrieval were complete.                            Estimated blood loss was minimal.                            The exam was otherwise normal throughout the                            examined colon.                           The retroflexed view of the distal rectum and anal                            verge was normal and showed no anal or rectal                            abnormalities. Complications:            No immediate complications. Estimated Blood Loss:     Estimated blood loss was minimal. Impression:               -  Five 2 to 5 mm polyps in the transverse colon,                            removed with a cold snare. Resected and retrieved.                           - One 4 mm polyp in the descending colon, removed                            with a cold snare. Resected and retrieved.                           - The distal rectum and anal verge are normal on                            retroflexion view. Recommendation:           - Patient has a contact number available for                            emergencies. The signs and symptoms of potential                            delayed complications were discussed with the                            patient. Return to normal activities tomorrow.                            Written discharge instructions were provided to the                            patient.                           - Resume previous diet.                           - Continue present medications.                           - Await pathology results.                           - Repeat colonoscopy (date not yet determined) for                            surveillance based on pathology results.                           - Recommend video capsule endoscopy for further                            evaluation of iron deficiency if biopsies from EGD  unrevealing. Elecia Serafin E. Tomasa Rand, MD 09/21/2023 2:58:51 PM This report has been signed electronically.

## 2023-09-21 NOTE — Progress Notes (Signed)
Called to room to assist during endoscopic procedure.  Patient ID and intended procedure confirmed with present staff. Received instructions for my participation in the procedure from the performing physician.  

## 2023-09-21 NOTE — Patient Instructions (Addendum)
Educational handout provided to patient related to Polyps  Resume previous diet  Continue present medications  Awaiting pathology results   YOU HAD AN ENDOSCOPIC PROCEDURE TODAY AT THE [facility]:   Refer to the procedure report that was given to you for any specific questions about what was found during the examination.  If the procedure report does not answer your questions, please call your gastroenterologist to clarify.  If you requested that your care partner not be given the details of your procedure findings, then the procedure report has been included in a sealed envelope for you to review at your convenience later.  YOU SHOULD EXPECT: Some feelings of bloating in the abdomen. Passage of more gas than usual.  Walking can help get rid of the air that was put into your GI tract during the procedure and reduce the bloating. If you had a lower endoscopy (such as a colonoscopy or flexible sigmoidoscopy) you may notice spotting of blood in your stool or on the toilet paper. If you underwent a bowel prep for your procedure, you may not have a normal bowel movement for a few days.  Please Note:  You might notice some irritation and congestion in your nose or some drainage.  This is from the oxygen used during your procedure.  There is no need for concern and it should clear up in a day or so.  SYMPTOMS TO REPORT IMMEDIATELY:  Following lower endoscopy (colonoscopy or flexible sigmoidoscopy):  Excessive amounts of blood in the stool  Significant tenderness or worsening of abdominal pains  Swelling of the abdomen that is new, acute  Fever of 100F or higher  Following upper endoscopy (EGD)  Vomiting of blood or coffee ground material  New chest pain or pain under the shoulder blades  Painful or persistently difficult swallowing  New shortness of breath  Fever of 100F or higher  Black, tarry-looking stools  For urgent or emergent issues, a gastroenterologist can be reached  at any hour by calling [phone]. Do not use MyChart messaging for urgent concerns.    DIET:  We do recommend a small meal at first, but then you may proceed to your regular diet.  Drink plenty of fluids but you should avoid alcoholic beverages for 24 hours.  ACTIVITY:  You should plan to take it easy for the rest of today and you should NOT DRIVE or use heavy machinery until tomorrow (because of the sedation medicines used during the test).    FOLLOW UP: Our staff will call the number listed on your records the next business day following your procedure.  We will call around 7:15- 8:00 am to check on you and address any questions or concerns that you may have regarding the information given to you following your procedure. If we do not reach you, we will leave a message.     If any biopsies were taken you will be contacted by phone or by letter within the next 1-3 weeks.  Please call us at [phone] if you have not heard about the biopsies in 3 weeks.    SIGNATURES/CONFIDENTIALITY: You and/or your care partner have signed paperwork which will be entered into your electronic medical record.  These signatures attest to the fact that that the information above on your After Visit Summary has been reviewed and is understood.  Full responsibility of the confidentiality of this discharge information lies with you and/or your care-partner.

## 2023-09-21 NOTE — Op Note (Signed)
[facility] Patient Name: Desiree Byrd Procedure Date: 09/21/2023 1:45 PM MRN: [id number] Endoscopist: Lorin Picket E. Tomasa Rand , MD, [id number] Age: 72 Referring MD:  Date of Birth: 03-Jul-1951 Gender: Female Account #: [id number] Procedure:                Upper GI endoscopy Indications:              Iron deficiency anemia Medicines:                Monitored Anesthesia Care Procedure:                Pre-Anesthesia Assessment:                           - Prior to the procedure, a History and Physical                            was performed, and patient medications and                            allergies were reviewed. The patient's tolerance of                            previous anesthesia was also reviewed. The risks                            and benefits of the procedure and the sedation                            options and risks were discussed with the patient.                            All questions were answered, and informed consent                            was obtained. Prior Anticoagulants: The patient has                            taken no anticoagulant or antiplatelet agents. ASA                            Grade Assessment: II - A patient with mild systemic                            disease. After reviewing the risks and benefits,                            the patient was deemed in satisfactory condition to                            undergo the procedure.                           After obtaining informed consent, the endoscope was  passed under direct vision. Throughout the                            procedure, the patient's blood pressure, pulse, and                            oxygen saturations were monitored continuously. The                            GIF [id number] was introduced through the                            mouth, and advanced to the third part of duodenum.                            The upper GI endoscopy was  accomplished without                            difficulty. The patient tolerated the procedure                            well. Scope In: Scope Out: Findings:                 The examined esophagus was normal.                           Diffuse mild mucosal changes characterized by                            granularity were found in the gastric body.                            Biopsies were taken with a cold forceps for                            Helicobacter pylori testing. Estimated blood loss                            was minimal.                           The exam of the stomach was otherwise normal.                           The examined duodenum was normal. Biopsies for                            histology were taken with a cold forceps for                            evaluation of celiac disease. Estimated blood loss                            was minimal. Complications:  No immediate complications. Estimated Blood Loss:     Estimated blood loss was minimal. Impression:               - Normal esophagus.                           - Granular mucosa in the gastric body. Biopsied.                           - Normal examined duodenum. Biopsied.                           - No obvious abnormalities to explain iron                            deficiency anemia Recommendation:           - Patient has a contact number available for                            emergencies. The signs and symptoms of potential                            delayed complications were discussed with the                            patient. Return to normal activities tomorrow.                            Written discharge instructions were provided to the                            patient.                           - Resume previous diet.                           - Continue present medications.                           - Await pathology results.                           - Proceed with  colonoscopy. Amato Sevillano E. Tomasa Rand, MD 09/21/2023 2:53:58 PM This report has been signed electronically.

## 2023-09-22 ENCOUNTER — Telehealth: Payer: Self-pay | Admitting: *Deleted

## 2023-09-22 NOTE — Telephone Encounter (Signed)
  Follow up Call-     09/21/2023   12:45 PM  Call back number  Post procedure Call Back phone  # [phone]  Permission to leave phone message Yes     Patient questions:  Do you have a fever, pain , or abdominal swelling? No. Pain Score  0 *  Have you tolerated food without any problems? Yes.    Have you been able to return to your normal activities? Yes.    Do you have any questions about your discharge instructions: Diet   No. Medications  No. Follow up visit  No.  Do you have questions or concerns about your Care? No.  Actions: * If pain score is 4 or above: No action needed, pain <4.

## 2023-09-24 ENCOUNTER — Encounter (HOSPITAL_COMMUNITY)
Admission: RE | Admit: 2023-09-24 | Discharge: 2023-09-24 | Disposition: A | Discharge status: Home / Self Care | Payer: Medicare Other | Source: Ambulatory Visit | Attending: Internal Medicine

## 2023-09-24 DIAGNOSIS — Z961 Presence of intraocular lens: Secondary | ICD-10-CM | POA: Diagnosis not present

## 2023-09-24 DIAGNOSIS — D509 Iron deficiency anemia, unspecified: Secondary | ICD-10-CM | POA: Diagnosis not present

## 2023-09-24 DIAGNOSIS — H40053 Ocular hypertension, bilateral: Secondary | ICD-10-CM | POA: Diagnosis not present

## 2023-09-24 MED ORDER — SODIUM CHLORIDE 0.9 % IV SOLN
510.0000 mg | INTRAVENOUS | Status: DC
  Administered 2023-09-24: 510 mg via INTRAVENOUS
  Filled 2023-09-24: qty 510

## 2023-09-27 ENCOUNTER — Encounter (HOSPITAL_COMMUNITY): Payer: Medicare Other

## 2023-09-27 ENCOUNTER — Encounter: Payer: Medicare Other | Admitting: Surgery

## 2023-09-27 LAB — SURGICAL PATHOLOGY

## 2023-09-28 ENCOUNTER — Other Ambulatory Visit: Payer: Self-pay

## 2023-09-28 DIAGNOSIS — A048 Other specified bacterial intestinal infections: Secondary | ICD-10-CM

## 2023-09-28 DIAGNOSIS — Z23 Encounter for immunization: Secondary | ICD-10-CM | POA: Diagnosis not present

## 2023-09-28 MED ORDER — DOXYCYCLINE HYCLATE 100 MG PO CAPS: 100.0000 mg | ORAL_CAPSULE | Freq: Two times a day (BID) | ORAL | 0 refills | Status: DC

## 2023-09-28 MED ORDER — BISMUTH 262 MG PO CHEW: 524.0000 mg | CHEWABLE_TABLET | Freq: Four times a day (QID) | ORAL | 0 refills | Status: DC

## 2023-09-28 MED ORDER — METRONIDAZOLE 250 MG PO TABS: 250.0000 mg | ORAL_TABLET | Freq: Four times a day (QID) | ORAL | 0 refills | Status: DC

## 2023-09-28 MED ORDER — OMEPRAZOLE 20 MG PO CPDR: 20.0000 mg | DELAYED_RELEASE_CAPSULE | Freq: Two times a day (BID) | ORAL | 0 refills | Status: DC

## 2023-09-28 NOTE — Progress Notes (Signed)
Desiree Byrd,  The biopsies from the recent upper GI Endoscopy were notable for H. Pylori gastritis.  This is the most likely cause of your iron deficiency anemia.  We will plan on treating with quad therapy as below. Please confirm no medication allergies to the prescribed regimen.   1) Omeprazole 20 mg 2 times a day x 14 d 2) Pepto Bismol 2 tabs (262 mg each) 4 times a day x 14 d 3) Metronidazole 250 mg 4 times a day x 14 d 4) doxycycline 100 mg 2 times a day x 14 d  After 14 days, ok to stop omeprazole.  4 weeks after treatment completed, check H. Pylori stool antigen to confirm eradication (must be off acid suppression therapy for 2 weeks prior to specimen submission)   The polyps that I removed during your recent procedure were completely benign but were proven to be "pre-cancerous" polyps that MAY have grown into cancers if they had not been removed.  Studies shows that at least 20% of women over age 23 and 30% of men over age 40 have pre-cancerous polyps.  Based on current nationally recognized surveillance guidelines, it would be recommended that you have a repeat colonoscopy in 3 years.   However, because colon cancer screening after age 7 is done on a case-by-case basis, taking into account the patient's risk factors for colon cancer, as well as comorbidities and life expectancy, I would recommend you make an appointment with me in 3 years to discuss the risks/benefits of further colon cancer screening.   If you develop any new rectal bleeding, abdominal pain or significant bowel habit changes, please contact me before then.

## 2023-10-04 ENCOUNTER — Ambulatory Visit: Payer: Medicare Other | Admitting: Surgery

## 2023-10-04 ENCOUNTER — Encounter (HOSPITAL_COMMUNITY): Payer: Medicare Other

## 2023-10-07 DIAGNOSIS — Z0000 Encounter for general adult medical examination without abnormal findings: Secondary | ICD-10-CM | POA: Diagnosis not present

## 2023-10-07 DIAGNOSIS — Z1389 Encounter for screening for other disorder: Secondary | ICD-10-CM | POA: Diagnosis not present

## 2023-10-07 DIAGNOSIS — D649 Anemia, unspecified: Secondary | ICD-10-CM | POA: Diagnosis not present

## 2023-10-07 DIAGNOSIS — E785 Hyperlipidemia, unspecified: Secondary | ICD-10-CM | POA: Diagnosis not present

## 2023-10-07 DIAGNOSIS — Z79899 Other long term (current) drug therapy: Secondary | ICD-10-CM | POA: Diagnosis not present

## 2023-10-07 DIAGNOSIS — R7989 Other specified abnormal findings of blood chemistry: Secondary | ICD-10-CM | POA: Diagnosis not present

## 2023-10-12 ENCOUNTER — Encounter: Payer: Medicare Other | Admitting: Gastroenterology

## 2023-10-29 ENCOUNTER — Encounter: Payer: Self-pay | Admitting: Gastroenterology

## 2023-11-01 ENCOUNTER — Other Ambulatory Visit: Payer: Medicare Other

## 2023-11-01 DIAGNOSIS — A048 Other specified bacterial intestinal infections: Secondary | ICD-10-CM

## 2023-11-03 LAB — H. PYLORI ANTIGEN, STOOL: H pylori Ag, Stl: NEGATIVE

## 2023-11-04 NOTE — Progress Notes (Signed)
Patient ID: Desiree Byrd, female   DOB: September 13, 1951, 72 y.o.   MRN: [id number]  Reason for Consult: New Patient (Initial Visit)   Referred by Alysia Penna, MD  Subjective:     HPI  Desiree Byrd is a 72 y.o. female who presents for follow-up of carotid artery disease.  She was last seen in 2021 with Dr. Durwin Nora where she was noted to have asymptomatic carotid artery stenosis of less than 39% bilaterally.  She continues to be asymptomatic and specifically denies one-sided weakness, numbness, amaurosis or speech issues.  She is a non-smoker.  Past Medical History:  Diagnosis Date   Arthritis    Carotid artery occlusion    Fasting hyperglycemia    Lung collapse    x2   PONV (postoperative nausea and vomiting)    Family History  Problem Relation Age of Onset   Colon polyps Mother    Hypertension Mother    Breast cancer Mother        in 72s   Lung cancer Mother    Heart disease Mother    Multiple sclerosis Father        Deceased   Stroke Neg Hx    Colon cancer Neg Hx    Past Surgical History:  Procedure Laterality Date   ANKLE SURGERY     COLONOSCOPY     Dr Juanda Chance   EYE SURGERY     INGUINAL HERNIA REPAIR Right 12/12/2020   Procedure: RIGHT INGUINAL HERNIA REPAIR;  Surgeon: Harriette Bouillon, MD;  Location: [facility];  Service: General;  Laterality: Right;   INSERTION OF MESH Right 12/12/2020   Procedure: INSERTION OF MESH;  Surgeon: Harriette Bouillon, MD;  Location: [facility];  Service: General;  Laterality: Right;   KNEE ARTHROSCOPY     LUMBAR LAMINECTOMY     x 2   LUNG SURGERY  6 years ago   due to lung collapse   SHOULDER SURGERY Right 12/17/2014   THYROIDECTOMY     Partial-for nodule    Short Social History:  Social History   Tobacco Use   Smoking status: Never   Smokeless tobacco: Never  Substance Use Topics   Alcohol use: Not Currently    Comment:  socially    No Known Allergies  Current Outpatient Medications  Medication Sig  Dispense Refill   Cholecalciferol (VITAMIN D3) 30 MCG/15ML LIQD Take by mouth.     ibuprofen (ADVIL) 800 MG tablet Take 800 mg by mouth every 8 (eight) hours as needed.     rosuvastatin (CRESTOR) 5 MG tablet rosuvastatin 5 mg tablet   1 tablet every other day by oral route.     No current facility-administered medications for this visit.    REVIEW OF SYSTEMS   All other systems were reviewed and are negative     Objective:  Objective   Vitals:   11/05/23 1449 11/05/23 1452  BP: 134/81 116/78  Pulse: 77   Resp: 20   Temp: 98.2 F (36.8 C)   SpO2: 97%   Weight: 138 lb (62.6 kg)   Height: 5\' 8"  (1.727 m)    Body mass index is 20.98 kg/m.  Physical Exam General: no acute distress Cardiac: hemodynamically stable, nontachycardic Pulm: normal work of breathing Neuro: alert, no focal deficit Extremities: no edema, cyanosis or wounds Vascular:   Right: Palpable radial  Left: Palpable radial   Data: Carotid duplex: Right Carotid Findings:  +----------+--------+--------+--------+------------------+-----------+  PSV cm/sEDV cm/sStenosisPlaque DescriptionComments     +----------+--------+--------+--------+------------------+-----------+  CCA Prox  118     19                                             +----------+--------+--------+--------+------------------+-----------+  CCA Mid   94      22              smooth                         +----------+--------+--------+--------+------------------+-----------+  CCA Distal68      17                                             +----------+--------+--------+--------+------------------+-----------+  ICA Prox  36      13      1-39%   smooth            near normal  +----------+--------+--------+--------+------------------+-----------+  ICA Mid   82      26                                             +----------+--------+--------+--------+------------------+-----------+  ICA  Distal50      17                                tortuous     +----------+--------+--------+--------+------------------+-----------+  ECA      82      14                                             +----------+--------+--------+--------+------------------+-----------+   +----------+--------+-------+----------------+-------------------+           PSV cm/sEDV cmsDescribe        Arm Pressure (mmHG)  +----------+--------+-------+----------------+-------------------+  [id number]           Multiphasic, [id number]                  +----------+--------+-------+----------------+-------------------+   +---------+--------+--+--------+--+---------+  VertebralPSV cm/s54EDV cm/s13Antegrade  +---------+--------+--+--------+--+---------+      Left Carotid Findings:  +----------+--------+--------+--------+------------------+-----------+           PSV cm/sEDV cm/sStenosisPlaque DescriptionComments     +----------+--------+--------+--------+------------------+-----------+  CCA Prox  118     24                                             +----------+--------+--------+--------+------------------+-----------+  CCA Mid   113     22              smooth                         +----------+--------+--------+--------+------------------+-----------+  CCA Distal92      24              smooth                         +----------+--------+--------+--------+------------------+-----------+  ICA Prox  47      14      1-39%                     near normal  +----------+--------+--------+--------+------------------+-----------+  ICA Mid   85      27                                             +----------+--------+--------+--------+------------------+-----------+  ICA Distal101     30                                             +----------+--------+--------+--------+------------------+-----------+  ECA      77      11                                              +----------+--------+--------+--------+------------------+-----------+   +----------+--------+--------+----------------+-------------------+           PSV cm/sEDV cm/sDescribe        Arm Pressure (mmHG)  +----------+--------+--------+----------------+-------------------+  [id number]            Multiphasic, JXB147                  +----------+--------+--------+----------------+-------------------+   Perviously < 39% bilaterally in 2021  Most recent CMP reviewed, creatinine 0.73  Most recent PCP note from Dr. Benjiman Core reviewed     Assessment/Plan:     Desiree Byrd is a 72 y.o. female presenting with asymptomatic carotid stenosis.  She is on best medical therapy, continues to have minimal stenosis with essentially no disease bilaterally.  I explained that given her minimal disease with no progression over the last 5 years we could stop surveillance at this time. She is in agreement and would like to stop surveillance. Follow-up as needed    Recommendations to optimize cardiovascular risk: Abstinence from all tobacco products. Blood glucose control with goal A1c < 7%. Blood pressure control with goal blood pressure < 140/90 mmHg. Lipid reduction therapy with goal LDL-C <100 mg/dL  Aspirin 81mg  PO QD.  Atorvastatin 40-80mg  PO QD (or other "high intensity" statin therapy).     Daria Pastures MD [facility]

## 2023-11-05 ENCOUNTER — Ambulatory Visit (INDEPENDENT_AMBULATORY_CARE_PROVIDER_SITE_OTHER): Payer: Medicare Other | Admitting: Vascular Surgery

## 2023-11-05 ENCOUNTER — Ambulatory Visit (HOSPITAL_COMMUNITY)
Admission: RE | Admit: 2023-11-05 | Discharge: 2023-11-05 | Disposition: A | Discharge status: Home / Self Care | Payer: Medicare Other | Source: Ambulatory Visit | Attending: Surgery | Admitting: Surgery

## 2023-11-05 ENCOUNTER — Encounter: Payer: Self-pay | Admitting: Vascular Surgery

## 2023-11-05 VITALS — BP 116/78 | HR 77 | Temp 98.2°F | Resp 20 | Ht 68.0 in | Wt 138.0 lb

## 2023-11-05 DIAGNOSIS — I6523 Occlusion and stenosis of bilateral carotid arteries: Secondary | ICD-10-CM

## 2023-11-05 DIAGNOSIS — I6529 Occlusion and stenosis of unspecified carotid artery: Secondary | ICD-10-CM | POA: Insufficient documentation

## 2023-11-06 NOTE — Progress Notes (Signed)
Desiree Byrd,  Your stool test was negative for H. Pylori, indicating successful eradication of the bacteria.  I would like to repeat a blood test to ensure that your hemoglobin has increased since August.  Please come to our lab sometime in the next 1-2 weeks for a repeat CBC.  [name],  Can you please place an order for CBC?

## 2023-11-08 ENCOUNTER — Other Ambulatory Visit: Payer: Self-pay | Admitting: *Deleted

## 2023-11-08 DIAGNOSIS — D509 Iron deficiency anemia, unspecified: Secondary | ICD-10-CM

## 2023-11-08 DIAGNOSIS — A048 Other specified bacterial intestinal infections: Secondary | ICD-10-CM

## 2023-11-11 ENCOUNTER — Encounter: Payer: Self-pay | Admitting: Gastroenterology

## 2023-11-25 DIAGNOSIS — H6123 Impacted cerumen, bilateral: Secondary | ICD-10-CM | POA: Diagnosis not present

## 2023-12-15 DIAGNOSIS — M25562 Pain in left knee: Secondary | ICD-10-CM | POA: Diagnosis not present

## 2024-01-04 DIAGNOSIS — E785 Hyperlipidemia, unspecified: Secondary | ICD-10-CM | POA: Diagnosis not present

## 2024-01-04 DIAGNOSIS — D649 Anemia, unspecified: Secondary | ICD-10-CM | POA: Diagnosis not present

## 2024-01-10 DIAGNOSIS — L821 Other seborrheic keratosis: Secondary | ICD-10-CM | POA: Diagnosis not present

## 2024-01-10 DIAGNOSIS — D485 Neoplasm of uncertain behavior of skin: Secondary | ICD-10-CM | POA: Diagnosis not present

## 2024-01-10 DIAGNOSIS — L989 Disorder of the skin and subcutaneous tissue, unspecified: Secondary | ICD-10-CM | POA: Diagnosis not present

## 2024-01-10 DIAGNOSIS — D1801 Hemangioma of skin and subcutaneous tissue: Secondary | ICD-10-CM | POA: Diagnosis not present

## 2024-01-10 DIAGNOSIS — Z85828 Personal history of other malignant neoplasm of skin: Secondary | ICD-10-CM | POA: Diagnosis not present

## 2024-01-10 DIAGNOSIS — L570 Actinic keratosis: Secondary | ICD-10-CM | POA: Diagnosis not present

## 2024-01-10 DIAGNOSIS — L650 Telogen effluvium: Secondary | ICD-10-CM | POA: Diagnosis not present

## 2024-01-10 DIAGNOSIS — D2271 Melanocytic nevi of right lower limb, including hip: Secondary | ICD-10-CM | POA: Diagnosis not present

## 2024-02-21 DIAGNOSIS — L988 Other specified disorders of the skin and subcutaneous tissue: Secondary | ICD-10-CM | POA: Diagnosis not present

## 2024-02-21 DIAGNOSIS — D485 Neoplasm of uncertain behavior of skin: Secondary | ICD-10-CM | POA: Diagnosis not present

## 2024-03-30 DIAGNOSIS — H40053 Ocular hypertension, bilateral: Secondary | ICD-10-CM | POA: Diagnosis not present

## 2024-03-30 DIAGNOSIS — Z961 Presence of intraocular lens: Secondary | ICD-10-CM | POA: Diagnosis not present

## 2024-04-03 ENCOUNTER — Ambulatory Visit (INDEPENDENT_AMBULATORY_CARE_PROVIDER_SITE_OTHER): Admitting: Family Medicine

## 2024-04-03 ENCOUNTER — Other Ambulatory Visit: Payer: Self-pay

## 2024-04-03 ENCOUNTER — Encounter: Payer: Self-pay | Admitting: Family Medicine

## 2024-04-03 VITALS — BP 110/70 | Ht 68.0 in | Wt 143.0 lb

## 2024-04-03 DIAGNOSIS — G8929 Other chronic pain: Secondary | ICD-10-CM

## 2024-04-03 DIAGNOSIS — M25562 Pain in left knee: Secondary | ICD-10-CM

## 2024-04-03 MED ORDER — METHYLPREDNISOLONE ACETATE 40 MG/ML IJ SUSP
40.0000 mg | Freq: Once | INTRAMUSCULAR | Status: AC
  Administered 2024-04-03: 40 mg via INTRA_ARTICULAR

## 2024-04-03 NOTE — Progress Notes (Addendum)
 PCP: Barnetta Liberty, MD  Subjective:   HPI: Patient is a 73 y.o. female here for left knee pain.  Went to [employer] and had 5 days of extensive walking.  Reports after her trip she started back on the recumbent bike and her pain began on the lateral surface of her knee.  Reports it is waking her from her sleep.  Has not been any activity and applied ice for the past 7 days with minimal improvement.  Very active at baseline, does 10 to 15 miles and recumbent bike daily and plays tennis and golf.  Using ibuprofen  400 mg twice daily and Voltaren gel.  H/O known left knee OA, follows with Dr. Abigail Abler with orthopedics who recommends knee replacement.  She is continuing with conservative management at this time.  Received intraarticular steroid injection in 11/2023 and states she gets "1 year".  Past Medical History:  Diagnosis Date   Arthritis    Carotid artery occlusion    Fasting hyperglycemia    Lung collapse    x2   PONV (postoperative nausea and vomiting)     Current Outpatient Medications on File Prior to Visit  Medication Sig Dispense Refill   Cholecalciferol (VITAMIN D3) 30 MCG/15ML LIQD Take by mouth.     ibuprofen  (ADVIL ) 800 MG tablet Take 800 mg by mouth every 8 (eight) hours as needed.     rosuvastatin (CRESTOR) 5 MG tablet rosuvastatin 5 mg tablet   1 tablet every other day by oral route.     No current facility-administered medications on file prior to visit.    Past Surgical History:  Procedure Laterality Date   ANKLE SURGERY     COLONOSCOPY     Dr Grandville Lax   EYE SURGERY     INGUINAL HERNIA REPAIR Right 12/12/2020   Procedure: RIGHT INGUINAL HERNIA REPAIR;  Surgeon: Sim Dryer, MD;  Location: [facility];  Service: General;  Laterality: Right;   INSERTION OF MESH Right 12/12/2020   Procedure: INSERTION OF MESH;  Surgeon: Sim Dryer, MD;  Location: [facility];  Service: General;  Laterality: Right;   KNEE ARTHROSCOPY     LUMBAR  LAMINECTOMY     x 2   LUNG SURGERY  6 years ago   due to lung collapse   SHOULDER SURGERY Right 12/17/2014   THYROIDECTOMY     Partial-for nodule    No Known Allergies  There were no vitals taken for this visit.      No data to display              No data to display              Objective:  Physical Exam:  Gen: NAD, comfortable in exam room MSK: Left knee: -No erythema, ecchymosis or deformity -TTP over distal IT band insertion site that extends proximally a few inches -Some tenderness along medial joint line, chronic and not worse from baseline -5/5 hip flexion and knee extension bilaterally -Significant pain with knee flexion of left side -Normal resisted left hip abduction -Negative varus, valgus, anterior posterior drawer - Positive Obers sign and Noble test  Bedside US : Inflammation and fluid between distal IT band and femur at the femoral condyle   Assessment & Plan:  1.  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  Injection as below.  Stretches given.  Provided home exercises and continue supportive care.  PROCEDURE: INJECTION: Patient was given informed consent, signed copy in the chart. Appropriate  time out was taken. Area prepped in usual sterile fashion. Ethyl chloride was used for local anesthesia. 1 cc of methylprednisolone  40 mg/ml plus  2 cc of 1% lidocaine  without epinephrine  was injected between left lateral femoral condyle and IT band using a(n) lateral approach.   The patient tolerated the procedure well. There were no complications. Post procedure instructions were given.

## 2024-04-11 DIAGNOSIS — L570 Actinic keratosis: Secondary | ICD-10-CM | POA: Diagnosis not present

## 2024-04-11 DIAGNOSIS — Z85828 Personal history of other malignant neoplasm of skin: Secondary | ICD-10-CM | POA: Diagnosis not present

## 2024-05-04 ENCOUNTER — Ambulatory Visit: Admitting: Sports Medicine

## 2024-05-15 ENCOUNTER — Ambulatory Visit (INDEPENDENT_AMBULATORY_CARE_PROVIDER_SITE_OTHER): Payer: Self-pay | Admitting: Podiatry

## 2024-05-15 DIAGNOSIS — L84 Corns and callosities: Secondary | ICD-10-CM | POA: Diagnosis not present

## 2024-05-15 DIAGNOSIS — M2011 Hallux valgus (acquired), right foot: Secondary | ICD-10-CM | POA: Diagnosis not present

## 2024-05-15 DIAGNOSIS — D649 Anemia, unspecified: Secondary | ICD-10-CM | POA: Insufficient documentation

## 2024-05-15 DIAGNOSIS — M2012 Hallux valgus (acquired), left foot: Secondary | ICD-10-CM | POA: Diagnosis not present

## 2024-05-15 DIAGNOSIS — I6523 Occlusion and stenosis of bilateral carotid arteries: Secondary | ICD-10-CM | POA: Insufficient documentation

## 2024-05-15 DIAGNOSIS — K5790 Diverticulosis of intestine, part unspecified, without perforation or abscess without bleeding: Secondary | ICD-10-CM | POA: Insufficient documentation

## 2024-05-15 DIAGNOSIS — D259 Leiomyoma of uterus, unspecified: Secondary | ICD-10-CM | POA: Insufficient documentation

## 2024-05-15 NOTE — Progress Notes (Unsigned)
     Chief Complaint  Patient presents with   Toe Pain    Right foot 2nd toe pt stated that she has this corn on her toe that has been there for a year and it has a burning sensation    HPI: 73 y.o. female presents today with concern of a painful corn on the medial aspect of the right second toe.  She acknowledges that her big toes are pushing against the second toes.  Past Medical History:  Diagnosis Date   Arthritis    Carotid artery occlusion    Fasting hyperglycemia    Lung collapse    x2   PONV (postoperative nausea and vomiting)    Past Surgical History:  Procedure Laterality Date   ANKLE SURGERY     COLONOSCOPY     Dr Obie   EYE SURGERY     INGUINAL HERNIA REPAIR Right 12/12/2020   Procedure: RIGHT INGUINAL HERNIA REPAIR;  Surgeon: Vanderbilt Ned, MD;  Location: [facility];  Service: General;  Laterality: Right;   INSERTION OF MESH Right 12/12/2020   Procedure: INSERTION OF MESH;  Surgeon: Vanderbilt Ned, MD;  Location: [facility];  Service: General;  Laterality: Right;   KNEE ARTHROSCOPY     LUMBAR LAMINECTOMY     x 2   LUNG SURGERY  6 years ago   due to lung collapse   SHOULDER SURGERY Right 12/17/2014   THYROIDECTOMY     Partial-for nodule   Allergies  Allergen Reactions   Pollen Extract     Other Reaction(s): Unknown    Physical Exam: Palpable pedal pulses noted.  No open lesions noted.  There is a hyperkeratotic lesion with pain on palpation on the dorsal medial aspect the right second toe DIPJ.  No surrounding erythema or clinical signs of infection are noted.  There is hallux valgus bilateral.  The right hallux valgus is semireducible on manipulation.  The left hallux valgus is fully reducible with manipulation.  There is no associated corn in the interspace on the left foot.  Epicritic sensation is intact  Assessment/Plan of Care: 1. Corns   2. Hallux valgus, right   3. Hallux valgus, left    Discussed findings with the  patient today.  Of the hyperkeratotic lesion was shaved with a sterile #313 blade.  Antibiotic ointment and a Band-Aid were applied.  She was given several different types of toe spacers to try to keep pressure off of the medial second toe.  If this is unsuccessful in the future, she could proceed with surgical correction of the hallux valgus and second toe, but she would like to hold off if possible.  F/u prn    Awanda CHARM Imperial, DPM, FACFAS [facility]     [address] [place], [place] [address]                Office [phone]  Fax [phone]

## 2024-05-16 DIAGNOSIS — H6123 Impacted cerumen, bilateral: Secondary | ICD-10-CM | POA: Diagnosis not present

## 2024-06-07 ENCOUNTER — Other Ambulatory Visit: Payer: Self-pay | Admitting: Obstetrics and Gynecology

## 2024-06-07 DIAGNOSIS — Z1231 Encounter for screening mammogram for malignant neoplasm of breast: Secondary | ICD-10-CM

## 2024-08-15 ENCOUNTER — Ambulatory Visit
Admission: RE | Admit: 2024-08-15 | Discharge: 2024-08-15 | Disposition: A | Source: Ambulatory Visit | Attending: Obstetrics and Gynecology | Admitting: Obstetrics and Gynecology

## 2024-08-15 DIAGNOSIS — Z1231 Encounter for screening mammogram for malignant neoplasm of breast: Secondary | ICD-10-CM | POA: Diagnosis not present

## 2024-08-15 DIAGNOSIS — Z6821 Body mass index (BMI) 21.0-21.9, adult: Secondary | ICD-10-CM | POA: Diagnosis not present

## 2024-08-15 DIAGNOSIS — Z01419 Encounter for gynecological examination (general) (routine) without abnormal findings: Secondary | ICD-10-CM | POA: Diagnosis not present

## 2024-08-16 DIAGNOSIS — M25562 Pain in left knee: Secondary | ICD-10-CM | POA: Diagnosis not present

## 2024-09-08 DIAGNOSIS — D649 Anemia, unspecified: Secondary | ICD-10-CM | POA: Diagnosis not present

## 2024-09-08 DIAGNOSIS — E785 Hyperlipidemia, unspecified: Secondary | ICD-10-CM | POA: Diagnosis not present

## 2024-09-08 DIAGNOSIS — Z79899 Other long term (current) drug therapy: Secondary | ICD-10-CM | POA: Diagnosis not present

## 2024-09-12 DIAGNOSIS — Z1331 Encounter for screening for depression: Secondary | ICD-10-CM | POA: Diagnosis not present

## 2024-09-12 DIAGNOSIS — Z862 Personal history of diseases of the blood and blood-forming organs and certain disorders involving the immune mechanism: Secondary | ICD-10-CM | POA: Diagnosis not present

## 2024-09-12 DIAGNOSIS — M1990 Unspecified osteoarthritis, unspecified site: Secondary | ICD-10-CM | POA: Diagnosis not present

## 2024-09-12 DIAGNOSIS — Z23 Encounter for immunization: Secondary | ICD-10-CM | POA: Diagnosis not present

## 2024-09-12 DIAGNOSIS — E785 Hyperlipidemia, unspecified: Secondary | ICD-10-CM | POA: Diagnosis not present

## 2024-09-12 DIAGNOSIS — Z0000 Encounter for general adult medical examination without abnormal findings: Secondary | ICD-10-CM | POA: Diagnosis not present

## 2024-09-12 DIAGNOSIS — Z1339 Encounter for screening examination for other mental health and behavioral disorders: Secondary | ICD-10-CM | POA: Diagnosis not present

## 2024-09-12 DIAGNOSIS — H6121 Impacted cerumen, right ear: Secondary | ICD-10-CM | POA: Diagnosis not present

## 2024-09-12 DIAGNOSIS — I6523 Occlusion and stenosis of bilateral carotid arteries: Secondary | ICD-10-CM | POA: Diagnosis not present

## 2024-09-12 DIAGNOSIS — Z8619 Personal history of other infectious and parasitic diseases: Secondary | ICD-10-CM | POA: Diagnosis not present

## 2024-10-02 DIAGNOSIS — L918 Other hypertrophic disorders of the skin: Secondary | ICD-10-CM | POA: Diagnosis not present

## 2024-10-02 DIAGNOSIS — L814 Other melanin hyperpigmentation: Secondary | ICD-10-CM | POA: Diagnosis not present

## 2024-10-02 DIAGNOSIS — D2271 Melanocytic nevi of right lower limb, including hip: Secondary | ICD-10-CM | POA: Diagnosis not present

## 2024-10-02 DIAGNOSIS — D1801 Hemangioma of skin and subcutaneous tissue: Secondary | ICD-10-CM | POA: Diagnosis not present

## 2024-10-02 DIAGNOSIS — Z85828 Personal history of other malignant neoplasm of skin: Secondary | ICD-10-CM | POA: Diagnosis not present

## 2024-10-02 DIAGNOSIS — D224 Melanocytic nevi of scalp and neck: Secondary | ICD-10-CM | POA: Diagnosis not present

## 2024-10-02 DIAGNOSIS — B078 Other viral warts: Secondary | ICD-10-CM | POA: Diagnosis not present

## 2024-10-02 DIAGNOSIS — D2262 Melanocytic nevi of left upper limb, including shoulder: Secondary | ICD-10-CM | POA: Diagnosis not present

## 2024-10-02 DIAGNOSIS — L738 Other specified follicular disorders: Secondary | ICD-10-CM | POA: Diagnosis not present

## 2024-10-02 DIAGNOSIS — L821 Other seborrheic keratosis: Secondary | ICD-10-CM | POA: Diagnosis not present

## 2024-10-02 DIAGNOSIS — L570 Actinic keratosis: Secondary | ICD-10-CM | POA: Diagnosis not present

## 2024-10-02 DIAGNOSIS — D692 Other nonthrombocytopenic purpura: Secondary | ICD-10-CM | POA: Diagnosis not present

## 2024-10-03 DIAGNOSIS — H26492 Other secondary cataract, left eye: Secondary | ICD-10-CM | POA: Diagnosis not present

## 2024-10-03 DIAGNOSIS — H40053 Ocular hypertension, bilateral: Secondary | ICD-10-CM | POA: Diagnosis not present
